# Patient Record
Sex: Male | Born: 1935 | Race: White | Hispanic: No | Smoking: Never smoker
Health system: Southern US, Community
[De-identification: ages and names within clinical notes are randomized; demographics above are authoritative.]

## PROBLEM LIST (undated history)

## (undated) DIAGNOSIS — I10 Essential (primary) hypertension: Secondary | ICD-10-CM

## (undated) DIAGNOSIS — E119 Type 2 diabetes mellitus without complications: Secondary | ICD-10-CM

## (undated) DIAGNOSIS — H409 Unspecified glaucoma: Secondary | ICD-10-CM

## (undated) DIAGNOSIS — M1990 Unspecified osteoarthritis, unspecified site: Secondary | ICD-10-CM

## (undated) HISTORY — PX: APPENDECTOMY: SHX54

---

## 2017-05-24 ENCOUNTER — Emergency Department: Payer: Medicare Other

## 2017-05-24 ENCOUNTER — Other Ambulatory Visit: Payer: Self-pay

## 2017-05-24 ENCOUNTER — Encounter: Payer: Self-pay | Admitting: Emergency Medicine

## 2017-05-24 ENCOUNTER — Emergency Department
Admission: EM | Admit: 2017-05-24 | Discharge: 2017-05-24 | Disposition: A | Discharge status: Home / Self Care | Payer: Medicare Other | Attending: Emergency Medicine | Admitting: Emergency Medicine

## 2017-05-24 DIAGNOSIS — Y999 Unspecified external cause status: Secondary | ICD-10-CM | POA: Insufficient documentation

## 2017-05-24 DIAGNOSIS — Z79899 Other long term (current) drug therapy: Secondary | ICD-10-CM | POA: Insufficient documentation

## 2017-05-24 DIAGNOSIS — Z794 Long term (current) use of insulin: Secondary | ICD-10-CM | POA: Insufficient documentation

## 2017-05-24 DIAGNOSIS — F0390 Unspecified dementia without behavioral disturbance: Secondary | ICD-10-CM | POA: Diagnosis not present

## 2017-05-24 DIAGNOSIS — W19XXXA Unspecified fall, initial encounter: Secondary | ICD-10-CM | POA: Diagnosis not present

## 2017-05-24 DIAGNOSIS — Y9210 Unspecified residential institution as the place of occurrence of the external cause: Secondary | ICD-10-CM | POA: Diagnosis not present

## 2017-05-24 DIAGNOSIS — I10 Essential (primary) hypertension: Secondary | ICD-10-CM | POA: Insufficient documentation

## 2017-05-24 DIAGNOSIS — S0003XA Contusion of scalp, initial encounter: Secondary | ICD-10-CM | POA: Diagnosis not present

## 2017-05-24 DIAGNOSIS — E119 Type 2 diabetes mellitus without complications: Secondary | ICD-10-CM | POA: Diagnosis not present

## 2017-05-24 DIAGNOSIS — S0990XA Unspecified injury of head, initial encounter: Secondary | ICD-10-CM | POA: Diagnosis present

## 2017-05-24 DIAGNOSIS — Y939 Activity, unspecified: Secondary | ICD-10-CM | POA: Diagnosis not present

## 2017-05-24 DIAGNOSIS — R296 Repeated falls: Secondary | ICD-10-CM

## 2017-05-24 HISTORY — DX: Unspecified osteoarthritis, unspecified site: M19.90

## 2017-05-24 HISTORY — DX: Type 2 diabetes mellitus without complications: E11.9

## 2017-05-24 HISTORY — DX: Essential (primary) hypertension: I10

## 2017-05-24 HISTORY — DX: Unspecified glaucoma: H40.9

## 2017-05-24 LAB — BASIC METABOLIC PANEL
Anion gap: 7 (ref 5–15)
BUN: 23 mg/dL — AB (ref 6–20)
CALCIUM: 8.8 mg/dL — AB (ref 8.9–10.3)
CO2: 27 mmol/L (ref 22–32)
CREATININE: 0.98 mg/dL (ref 0.61–1.24)
Chloride: 104 mmol/L (ref 101–111)
Glucose, Bld: 132 mg/dL — ABNORMAL HIGH (ref 65–99)
Potassium: 4.8 mmol/L (ref 3.5–5.1)
SODIUM: 138 mmol/L (ref 135–145)

## 2017-05-24 LAB — CBC WITH DIFFERENTIAL/PLATELET
BASOS ABS: 0.1 10*3/uL (ref 0–0.1)
BASOS PCT: 0 %
EOS ABS: 0.2 10*3/uL (ref 0–0.7)
EOS PCT: 1 %
HCT: 41.4 % (ref 40.0–52.0)
HEMOGLOBIN: 13.8 g/dL (ref 13.0–18.0)
Lymphocytes Relative: 18 %
Lymphs Abs: 2.6 10*3/uL (ref 1.0–3.6)
MCH: 30.2 pg (ref 26.0–34.0)
MCHC: 33.3 g/dL (ref 32.0–36.0)
MCV: 90.8 fL (ref 80.0–100.0)
Monocytes Absolute: 1.4 10*3/uL — ABNORMAL HIGH (ref 0.2–1.0)
Monocytes Relative: 10 %
NEUTROS PCT: 71 %
Neutro Abs: 10.1 10*3/uL — ABNORMAL HIGH (ref 1.4–6.5)
PLATELETS: 290 10*3/uL (ref 150–440)
RBC: 4.56 MIL/uL (ref 4.40–5.90)
RDW: 13.4 % (ref 11.5–14.5)
WBC: 14.4 10*3/uL — AB (ref 3.8–10.6)

## 2017-05-24 LAB — TROPONIN I: Troponin I: <0.03 ng/mL (ref <0.03)

## 2017-05-24 NOTE — Discharge Instructions (Signed)
Patient was evaluated after fall, his exam and evaluation are overall reassuring in the emergency department today.  Return to emergency department immediately for any worsening condition including confusion altered mental status, fever, trouble breathing, abdominal pain, vomiting, or any other symptoms concerning to you.

## 2017-05-24 NOTE — Clinical Social Work Note (Signed)
Clinical Social Work Assessment  Patient Details  Name: Max KalataRaymond Martinezgarcia MRN: [id number] Date of Birth: 05/01/1929  Date of referral:  05/24/17               Reason for consult:  Facility Placement, Family Concerns                Permission sought to share information with:  Family Supports, Magazine features editoracility Contact Representative Permission granted to share information::  Yes, Verbal Permission Granted  Name::     Daughter HCPOA- Read DriversMelissa Kirkland [phone]   Agency::  All facilities  Relationship::     Contact Information:     Housing/Transportation Living arrangements for the past 2 months:  Independent Living Facility(Has additional in home supports ( private pay)) Source of Information:  Patient, Adult Children Patient Interpreter Needed:  None Criminal Activity/Legal Involvement Pertinent to Current Situation/Hospitalization:  No - Comment as needed Significant Relationships:  Adult Children Lives with:  Self, Facility Resident Do you feel safe going back to the place where you live?   yes Need for family participation in patient care:  Yes (Comment)  Care giving concerns: Daughter very worried that he will take off- feels her father may need locked unit   Social Worker assessment / plan: LCSW introduced myself to patient and his daughter and obtained verbal consent to collect information to complete assessment. In discussion it became apparent she was not happy with level of care from ALF- or the locked memory unit. She was explained the process to engage with other facilities ( locked care units) that might be a better fit for her father. LCSW provided SNF- ALF memory care list and she is familiar with Fl2 and agreeable to have this information on the hub. She will make the decision to increase her fathers care by hiring private pay support. Patient is an 84 year old man who was recently widowed ( March 2018) He and his wife married for  63 years. Patient is diabetic ( needs insulin) He  is mobile, incontinent bladder, not bowel. He is blind in right eye ( [place]) He has a daughter and 3 sons. His daughter and granddaughter visit often at ALF and he is independent still with his ADL's. Daughter is agreeable for patient to return to ALF- and will provide increased personal care- List of personal in home supports provided. She is agreeable to start looking for either additional help or other ALF. Reviewed good self care practices with daughter and provided 1-1 supportive care- several resources shared with daughter to assist with all her new family concerns.  Employment status:  Retired Health and safety inspectornsurance information:  Medicare([employer]) PT Recommendations:  Not assessed at this time Information / Referral to community resources:  Skilled Holiday representativeursing Facility, Other (Comment Required)(ALF- Memory care facilities)  Patient/Family's Response to care:  Good understanding  Patient/Family's Understanding of and Emotional Response to Diagnosis, Current Treatment, and Prognosis:  Good understanding  Emotional Assessment Appearance:  Appears stated age Attitude/Demeanor/Rapport:  Complaining Affect (typically observed):  Appropriate, Depressed Orientation:  Oriented to Self, Oriented to Place, Oriented to Situation Alcohol / Substance use:  Not Applicable Psych involvement (Current and /or in the community):  No (Comment)  Discharge Needs  Concerns to be addressed:  No discharge needs identified Readmission within the last 30 days:  No Current discharge risk:  None Barriers to Discharge:  Continued Medical Work up   RagsdaleBandi, Quitmanlaudine M, LCSW 05/24/2017, 12:22 PM

## 2017-05-24 NOTE — [place] FL2 (Cosign Needed)
[place] MEDICAID FL2 LEVEL OF CARE SCREENING TOOL     IDENTIFICATION  Patient Name: Zan Orlick Birthdate: 03/16/1929 Sex: male Admission Date (Current Location): 05/24/2017  [place] and [place] Number:  Chiropodist and Address:  [facility], [address] [place], [place] [address]      Provider Number: [id number]  Attending Physician Name and Address:  Governor Rooks, MD  Relative Name and Phone Number:     Read Drivers [phone]   Current Level of Care: Hospital Recommended Level of Care: Assisted Living Facility, Memory Care Prior Approval Number:    Date Approved/Denied:   PASRR Number:    Discharge Plan: Domiciliary (Rest home)    Current Diagnoses: There are no active problems to display for this patient.   Orientation RESPIRATION BLADDER Height & Weight     Self, Place  Normal Incontinent Weight: 256 lb (116.1 kg) Height:  5' 7.5" (171.5 cm)  BEHAVIORAL SYMPTOMS/MOOD NEUROLOGICAL BOWEL NUTRITION STATUS    (Patient does not have Parkinsons however Neuroligist in June stated he might be having slight mini strokes) Continent Diet(Diabetic)  AMBULATORY STATUS COMMUNICATION OF NEEDS Skin   Supervision Verbally Normal                       Personal Care Assistance Level of Assistance  Bathing, Feeding, Dressing, Total care Bathing Assistance: Independent Feeding assistance: Independent Dressing Assistance: Independent Total Care Assistance: Independent   Functional Limitations Info  Sight, Hearing, Speech Sight Info: Impaired(Right eye blind MD) Hearing Info: Adequate Speech Info: Adequate    SPECIAL CARE FACTORS FREQUENCY                       Contractures Contractures Info: Not present    Additional Factors Info  Insulin Sliding Scale       Insulin Sliding Scale Info: See Medications       Current Medications (05/24/2017):  This is the current hospital active medication list No  current facility-administered medications for this encounter.    Current Outpatient Medications  Medication Sig Dispense Refill  . acetaminophen (TYLENOL) 500 MG tablet Take 500 mg by mouth 2 (two) times daily as needed.    Marland Kitchen ascorbic acid (VITAMIN C) 500 MG tablet Take 500 mg by mouth daily.    . bisacodyl (DULCOLAX) 5 MG EC tablet Take 10 mg by mouth daily.    . brimonidine-timolol (COMBIGAN) 0.2-0.5 % ophthalmic solution Place 1 drop into the right eye 2 (two) times daily.    . cycloSPORINE (RESTASIS) 0.05 % ophthalmic emulsion Place 1 drop into the right eye 2 (two) times daily.    . Dulaglutide (TRULICITY) 1.5 MG/0.5ML SOPN Inject 1.5 mg into the skin once a week.    . DULoxetine (CYMBALTA) 60 MG capsule Take 60 mg by mouth daily.    Marland Kitchen glipiZIDE (GLUCOTROL XL) 10 MG 24 hr tablet Take 20 mg by mouth daily with breakfast.    . insulin glargine (LANTUS) 100 UNIT/ML injection Inject 20 Units into the skin 2 (two) times daily.    Marland Kitchen latanoprost (XALATAN) 0.005 % ophthalmic solution Place 1 drop into the right eye at bedtime.    Marland Kitchen lisinopril (PRINIVIL,ZESTRIL) 10 MG tablet Take 10 mg by mouth daily.    Marland Kitchen losartan (COZAAR) 100 MG tablet Take 100 mg by mouth daily.    . mirabegron ER (MYRBETRIQ) 50 MG TB24 tablet Take 50 mg by mouth daily.    Marland Kitchen  mirtazapine (REMERON) 7.5 MG tablet Take 7.5 mg by mouth at bedtime.    . Multiple Vitamin (MULTIVITAMIN WITH MINERALS) TABS tablet Take 1 tablet by mouth daily.    . pioglitazone (ACTOS) 15 MG tablet Take 15 mg by mouth daily.    . ranitidine (ZANTAC) 150 MG tablet Take 150 mg by mouth 2 (two) times daily.    . solifenacin (VESICARE) 10 MG tablet Take 10 mg by mouth daily.    . traZODone (DESYREL) 50 MG tablet Take 50 mg by mouth every 8 (eight) hours as needed for sleep.       Discharge Medications: Please see discharge summary for a list of discharge medications.  Relevant Imaging Results:  Relevant Lab Results:   Additional Information  SSN  [id number]  Cheron SchaumannBandi, Chibueze Beasley M, [place]

## 2017-05-24 NOTE — Progress Notes (Signed)
LCSW completed assessment and Fl2 started and information sent Via the HUB for other ALF memory care facilities for this patients future needs the facilities will contact patient directly.   Daughter is agreeable for patient to return to ALF- and will provide increased personal care- List of personal in home supports provided. She is agreeable to start looking for either additional help or other ALF. Reviewed good self care practices with daughter and provided 1-1 supportive care- several resources shared with daughter to assist with all her new family concerns.  No further needs at this time

## 2017-05-24 NOTE — ED Notes (Signed)
Pt daughter is POA and pt is a DNR. [facility] has [employer]. Daugther provided this RN will POA paperwork to apply to chart.

## 2017-05-24 NOTE — ED Triage Notes (Signed)
[facility]; pt with hematoma to the back of his head; pt confused to events this am; pt says he was being wheeled into the hospital for a check up when he fell; pt oriented to person and place, not time; talking in complete coherent sentences;

## 2017-05-24 NOTE — ED Notes (Signed)
Pt daugther states that pt was dx with mini strokes and not parkinson in 06/2016. [facility] [place]. Daugther requesting information/ referral to switch pt to local Neuro. EDP made aware.

## 2017-05-24 NOTE — ED Provider Notes (Signed)
[facility] Emergency Department Provider Note ____________________________________________   I have reviewed the triage vital signs and the triage nursing note.  HISTORY  Chief Complaint Fall and Back Pain   Historian Level 5 Caveat History Limited by bit of a poor historian  HPI Max [name] is a 84 y.o. male arrives from assisted living, apparently unwitnessed fall.  Patient is not really able to tell me clearly how or why he fell.  Other than that he seems to be pleasant and interactive.  He is denying pain, but does report having a bump on the back of his scalp.  He states he is not on any blood thinners.  Is reporting some back pain which she is really kind of nonspecific about it seems like it is in the mid or lower T and upper L-spine.   Past Medical History:  Diagnosis Date  . Arthritis   . Diabetes mellitus without complication (HCC)   . Glaucoma   . Hypertension     There are no active problems to display for this patient.   Past Surgical History:  Procedure Laterality Date  . APPENDECTOMY      Prior to Admission medications   Medication Sig Start Date End Date Taking? Authorizing Provider  acetaminophen (TYLENOL) 500 MG tablet Take 500 mg by mouth 2 (two) times daily as needed.   Yes [provider]  ascorbic acid (VITAMIN C) 500 MG tablet Take 500 mg by mouth daily.   Yes [provider]  bisacodyl (DULCOLAX) 5 MG EC tablet Take 10 mg by mouth daily.   Yes [provider]  brimonidine-timolol (COMBIGAN) 0.2-0.5 % ophthalmic solution Place 1 drop into the right eye 2 (two) times daily.   Yes [provider]  cycloSPORINE (RESTASIS) 0.05 % ophthalmic emulsion Place 1 drop into the right eye 2 (two) times daily.   Yes [provider]  Dulaglutide (TRULICITY) 1.5 MG/0.5ML SOPN Inject 1.5 mg into the skin once a week.   Yes [provider]  DULoxetine (CYMBALTA) 60 MG capsule Take  60 mg by mouth daily.   Yes [provider]  glipiZIDE (GLUCOTROL XL) 10 MG 24 hr tablet Take 20 mg by mouth daily with breakfast.   Yes [provider]  insulin glargine (LANTUS) 100 UNIT/ML injection Inject 20 Units into the skin 2 (two) times daily.   Yes [provider]  latanoprost (XALATAN) 0.005 % ophthalmic solution Place 1 drop into the right eye at bedtime.   Yes [provider]  lisinopril (PRINIVIL,ZESTRIL) 10 MG tablet Take 10 mg by mouth daily.   Yes [provider]  losartan (COZAAR) 100 MG tablet Take 100 mg by mouth daily.   Yes [provider]  mirabegron ER (MYRBETRIQ) 50 MG TB24 tablet Take 50 mg by mouth daily.   Yes [provider]  mirtazapine (REMERON) 7.5 MG tablet Take 7.5 mg by mouth at bedtime.   Yes [provider]  Multiple Vitamin (MULTIVITAMIN WITH MINERALS) TABS tablet Take 1 tablet by mouth daily.   Yes [provider]  pioglitazone (ACTOS) 15 MG tablet Take 15 mg by mouth daily.   Yes [provider]  ranitidine (ZANTAC) 150 MG tablet Take 150 mg by mouth 2 (two) times daily.   Yes [provider]  solifenacin (VESICARE) 10 MG tablet Take 10 mg by mouth daily.   Yes [provider]  traZODone (DESYREL) 50 MG tablet Take 50 mg by mouth every 8 (eight)  hours as needed for sleep.   Yes [provider]    No Known Allergies  History reviewed. No pertinent family history.  Social History Social History   Tobacco Use  . Smoking status: Never Smoker  . Smokeless tobacco: Never Used  Substance Use Topics  . Alcohol use: Yes    Comment: "very very little"  . Drug use: Never    Review of Systems  Constitutional: Negative for fever. Eyes: Negative for visual changes. ENT: Negative for sore throat. Cardiovascular: Negative for chest pain. Respiratory: Negative for shortness of breath. Gastrointestinal: Negative for abdominal pain, vomiting  and diarrhea. Genitourinary: Negative for dysuria. Musculoskeletal: Positive as per HPI for back pain. Skin: Negative for rash. Neurological: Negative for headache.  ____________________________________________   PHYSICAL EXAM:  VITAL SIGNS: ED Triage Vitals  Enc Vitals Group     BP 05/24/17 0658 (!) 141/106     Pulse Rate 05/24/17 0658 82     Resp 05/24/17 0658 20     Temp 05/24/17 0658 97.8 F (36.6 C)     Temp Source 05/24/17 0658 Oral     SpO2 05/24/17 0652 97 %     Weight 05/24/17 0700 256 lb (116.1 kg)     Height 05/24/17 0700 5' 7.5" (1.715 m)     Head Circumference --      Peak Flow --      Pain Score 05/24/17 0659 5     Pain Loc --      Pain Edu? --      Excl. in GC? --      Constitutional: Alert and cooperative, bit of a poor historian. Well appearing and in no distress. HEENT   Head: Hematoma without skin break to the posterior scalp.      Eyes: Conjunctivae are normal. Pupils equal and round.       Ears:         Nose: No congestion/rhinnorhea.   Mouth/Throat: Mucous membranes are moist.   Neck: No stridor.  No midline C-spine tenderness or step-off to palpation and range of motion. Cardiovascular/Chest: Normal rate, regular rhythm.  No murmurs, rubs, or gallops. Respiratory: Normal respiratory effort without tachypnea nor retractions. Breath sounds are clear and equal bilaterally. No wheezes/rales/rhonchi. Gastrointestinal: Soft. No distention, no guarding, no rebound. Nontender.  Obese Genitourinary/rectal:Deferred Musculoskeletal: Nontender with normal range of motion in all extremities. No joint effusions.  No lower extremity tenderness.  2 Plus lower extremity edema. Neurologic:  Normal speech and language. No gross or focal neurologic deficits are appreciated. Skin:  Skin is warm, dry and intact. No rash noted. Psychiatric: Mood and affect are normal. Speech and behavior are normal. Patient exhibits appropriate insight and  judgment.   ____________________________________________  LABS (pertinent positives/negatives) I, Governor Rooks, MD the attending physician have reviewed the labs noted below.  Labs Reviewed  BASIC METABOLIC PANEL - Abnormal; Notable for the following components:      Result Value   Glucose, Bld 132 (*)    BUN 23 (*)    Calcium 8.8 (*)    All other components within normal limits  CBC WITH DIFFERENTIAL/PLATELET - Abnormal; Notable for the following components:   WBC 14.4 (*)    Neutro Abs 10.1 (*)    Monocytes Absolute 1.4 (*)    All other components within normal limits  TROPONIN I  URINALYSIS, COMPLETE (UACMP) WITH MICROSCOPIC    ____________________________________________    EKG I, Governor Rooks, MD, the attending physician have personally viewed and interpreted  all ECGs.  81 bpm.  Irregularly irregular rhythm.  Appears to be normal sinus rhythm with sinus arrhythmia.  Narrow QRS.  Normal axis.  Normal ST and T wave ____________________________________________  RADIOLOGY   CT head without contrast radiologist interpretation:  IMPRESSION: 1. No acute abnormality. 2. Moderate diffuse cerebral and cerebellar atrophy. 3. Mild to moderate chronic small vessel white matter ischemic changes in both cerebral hemispheres. 4. Minimal chronic left maxillary sinusitis.   DG thoracic and lumbar x-rays interpreted by radiologist: IMPRESSION: No fracture.  Multilevel degenerative changes. __________________________________________  PROCEDURES  Procedure(s) performed: None  Procedures  Critical Care performed: None   ____________________________________________  ED COURSE / ASSESSMENT AND PLAN  Pertinent labs & imaging results that were available during my care of the patient were reviewed by me and considered in my medical decision making (see chart for details).   Patient arrived for evaluation after unwitnessed fall.  It sounds like it was probably mechanical,  although the patient is a very poor historian, we will go ahead and check laboratory studies and an EKG out of an abundance of precaution from the standpoint of whether not this could have been syncope it seems unlikely.  From a traumatic injury standpoint, he has a hematoma to the back of his scalp and is complaining of some kind of generalized pain to his back.  Laboratory studies are overall reassuring.  Uncertain etiology of slightly elevated white blood cell count.  He is not having other symptoms of infection.  I spoke with the family, they did not feel like it was likely that he had an acute urinary tract infection because he has had that checked recently was negative.  He is not coughing.  He is not hypoxic.  Not suspicious of pneumonia or upper respiratory infection.  Plan will be for patient to return to his assisted living facility, and family has resources for accessing additional care.   CONSULTATIONS:   Family spoke with social work, given resources.   Patient / Family / Caregiver informed of clinical course, medical decision-making process, and agree with plan.   I discussed return precautions, follow-up instructions, and discharge instructions with patient and/or family.  Discharge Instructions : Patient was evaluated after fall, his exam and evaluation are overall reassuring in the emergency department today.  Return to emergency department immediately for any worsening condition including confusion altered mental status, fever, trouble breathing, abdominal pain, vomiting, or any other symptoms concerning to you.    ___________________________________________   FINAL CLINICAL IMPRESSION(S) / ED DIAGNOSES   Final diagnoses:  Unwitnessed fall  Dementia without behavioral disturbance, unspecified dementia type      ___________________________________________        Note: This dictation was prepared with Dragon dictation. Any transcriptional errors that  result from this process are unintentional    Governor Rooks, MD 05/24/17 1435

## 2017-08-04 ENCOUNTER — Emergency Department: Payer: Medicare Other

## 2017-08-04 ENCOUNTER — Emergency Department
Admission: EM | Admit: 2017-08-04 | Discharge: 2017-08-04 | Disposition: A | Discharge status: Home / Self Care | Payer: Medicare Other | Attending: Student in an Organized Health Care Education/Training Program | Admitting: Student in an Organized Health Care Education/Training Program

## 2017-08-04 ENCOUNTER — Other Ambulatory Visit: Payer: Self-pay

## 2017-08-04 DIAGNOSIS — F0390 Unspecified dementia without behavioral disturbance: Secondary | ICD-10-CM | POA: Insufficient documentation

## 2017-08-04 DIAGNOSIS — Z794 Long term (current) use of insulin: Secondary | ICD-10-CM | POA: Diagnosis not present

## 2017-08-04 DIAGNOSIS — Z79899 Other long term (current) drug therapy: Secondary | ICD-10-CM | POA: Insufficient documentation

## 2017-08-04 DIAGNOSIS — W19XXXA Unspecified fall, initial encounter: Secondary | ICD-10-CM

## 2017-08-04 DIAGNOSIS — E119 Type 2 diabetes mellitus without complications: Secondary | ICD-10-CM | POA: Diagnosis not present

## 2017-08-04 DIAGNOSIS — I10 Essential (primary) hypertension: Secondary | ICD-10-CM | POA: Insufficient documentation

## 2017-08-04 DIAGNOSIS — M25511 Pain in right shoulder: Secondary | ICD-10-CM

## 2017-08-04 LAB — CBC
HEMATOCRIT: 41.1 % (ref 40.0–52.0)
HEMOGLOBIN: 14 g/dL (ref 13.0–18.0)
MCH: 30.9 pg (ref 26.0–34.0)
MCHC: 34 g/dL (ref 32.0–36.0)
MCV: 91.1 fL (ref 80.0–100.0)
Platelets: 267 10*3/uL (ref 150–440)
RBC: 4.51 MIL/uL (ref 4.40–5.90)
RDW: 14 % (ref 11.5–14.5)
WBC: 10.4 10*3/uL (ref 3.8–10.6)

## 2017-08-04 LAB — URINALYSIS, COMPLETE (UACMP) WITH MICROSCOPIC
Bacteria, UA: NONE SEEN
Bilirubin Urine: NEGATIVE
Glucose, UA: NEGATIVE mg/dL
HGB URINE DIPSTICK: NEGATIVE
Ketones, ur: NEGATIVE mg/dL
Leukocytes, UA: NEGATIVE
Nitrite: NEGATIVE
PH: 7 (ref 5.0–8.0)
Protein, ur: NEGATIVE mg/dL
SPECIFIC GRAVITY, URINE: 1.008 (ref 1.005–1.030)

## 2017-08-04 LAB — BASIC METABOLIC PANEL
Anion gap: 8 (ref 5–15)
BUN: 16 mg/dL (ref 6–20)
CHLORIDE: 101 mmol/L (ref 101–111)
CO2: 29 mmol/L (ref 22–32)
CREATININE: 0.84 mg/dL (ref 0.61–1.24)
Calcium: 8.9 mg/dL (ref 8.9–10.3)
GFR calc non Af Amer: >60 mL/min (ref >60)
GLUCOSE: 113 mg/dL — AB (ref 65–99)
Potassium: 4.2 mmol/L (ref 3.5–5.1)
Sodium: 138 mmol/L (ref 135–145)

## 2017-08-04 LAB — CK: Total CK: 53 U/L (ref 49–397)

## 2017-08-04 NOTE — ED Notes (Signed)
Upon going in to do in/out cath, family reports pt just used urinal.  Pt provided cup of water; will reattempt to collect urinary specimen.

## 2017-08-04 NOTE — ED Notes (Signed)
Pt had episode of urinary incontinence; changed sheets. Attempted to use urinal-cannot provide urine sample at this time.

## 2017-08-04 NOTE — ED Provider Notes (Signed)
[facility] Emergency Department Provider Note    First MD Initiated Contact with Patient 08/04/17 0915     (approximate)  I have reviewed the triage vital signs and the nursing notes.   HISTORY  Chief Complaint Fall  Level V Caveat:  dementia  HPI Max [name] is a 84 y.o. male the history of diabetes as well as hypertension presents to the ER after unwitnessed fall with unknown downtime.  Patient states he thinks it is only 3 hours but cannot confirm.  History limited as it patient is confused and thinks that it is 25.  Denies any headache.  No nausea or vomiting.  No recent fevers.  His only pain is right shoulder pain which he states is chronic.    Past Medical History:  Diagnosis Date  . Arthritis   . Diabetes mellitus without complication (HCC)   . Glaucoma   . Hypertension    No family history on file. Past Surgical History:  Procedure Laterality Date  . APPENDECTOMY     There are no active problems to display for this patient.     Prior to Admission medications   Medication Sig Start Date End Date Taking? Authorizing Provider  ascorbic acid (VITAMIN C) 500 MG tablet Take 500 mg by mouth daily.   Yes [provider]  bisacodyl (DULCOLAX) 5 MG EC tablet Take 10 mg by mouth daily.   Yes [provider]  brimonidine-timolol (COMBIGAN) 0.2-0.5 % ophthalmic solution Place 1 drop into the right eye 2 (two) times daily.   Yes [provider]  cycloSPORINE (RESTASIS) 0.05 % ophthalmic emulsion Place 1 drop into the right eye 2 (two) times daily.   Yes [provider]  DULoxetine (CYMBALTA) 60 MG capsule Take 60 mg by mouth daily.   Yes [provider]  glipiZIDE (GLUCOTROL XL) 10 MG 24 hr tablet Take 20 mg by mouth daily with breakfast.   Yes [provider]  insulin glargine (LANTUS) 100 UNIT/ML injection Inject 20 Units into the skin 2 (two) times daily.   Yes [provider]    latanoprost (XALATAN) 0.005 % ophthalmic solution Place 1 drop into the right eye at bedtime.   Yes [provider]  losartan (COZAAR) 100 MG tablet Take 100 mg by mouth daily.   Yes [provider]  mirabegron ER (MYRBETRIQ) 50 MG TB24 tablet Take 50 mg by mouth daily.   Yes [provider]  mirtazapine (REMERON) 7.5 MG tablet Take 7.5 mg by mouth at bedtime.   Yes [provider]  Multiple Vitamin (MULTIVITAMIN WITH MINERALS) TABS tablet Take 1 tablet by mouth daily.   Yes [provider]  pioglitazone (ACTOS) 15 MG tablet Take 15 mg by mouth daily.   Yes [provider]  ranitidine (ZANTAC) 150 MG tablet Take 150 mg by mouth daily.    Yes [provider]  solifenacin (VESICARE) 10 MG tablet Take 10 mg by mouth daily.   Yes [provider]  traZODone (DESYREL) 50 MG tablet Take 50 mg by mouth 2 (two) times daily.    Yes [provider]  acetaminophen (TYLENOL) 500 MG tablet Take 500 mg by mouth 2 (two) times daily as needed.    [provider]    Allergies Patient has no known allergies.    Social History Social History   Tobacco Use  . Smoking status: Never Smoker  . Smokeless tobacco: Never Used  Substance Use Topics  . Alcohol  use: Yes    Comment: "very very little"  . Drug use: Never    Review of Systems Patient denies headaches, rhinorrhea, blurry vision, numbness, shortness of breath, chest pain, edema, cough, abdominal pain, nausea, vomiting, diarrhea, dysuria, fevers, rashes or hallucinations unless otherwise stated above in HPI. ____________________________________________   PHYSICAL EXAM:  VITAL SIGNS: Vitals:   08/04/17 1200 08/04/17 1230  BP: (!) 150/103 129/78  Pulse: 72 69  Resp: 15 17  Temp:    SpO2: 100% 98%    Constitutional: Alert and disoriented x 2  Eyes: Conjunctivae are normal.  Head: Atraumatic. Nose: No congestion/rhinnorhea. Mouth/Throat: Mucous  membranes are moist.   Neck: No stridor. Painless ROM.  Cardiovascular: Normal rate, regular rhythm. Grossly normal heart sounds.  Good peripheral circulation. Respiratory: Normal respiratory effort.  No retractions. Lungs CTAB. Gastrointestinal: Soft and nontender. No distention. No abdominal bruits. No CVA tenderness. Genitourinary: normal external genitalia Musculoskeletal: No lower extremity tenderness nor edema.  No joint effusions. Neurologic:  Normal speech and language. No gross focal neurologic deficits are appreciated. No facial droop Skin:  Skin is warm, dry and intact. No rash noted. Psychiatric: Mood and affect are normal. Speech and behavior are normal.   ____________________________________________   LABS (all labs ordered are listed, but only abnormal results are displayed)  Results for orders placed or performed during the hospital encounter of 08/04/17 (from the past 24 hour(s))  CBC     Status: None   Collection Time: 08/04/17 10:04 AM  Result Value Ref Range   WBC 10.4 3.8 - 10.6 K/uL   RBC 4.51 4.40 - 5.90 MIL/uL   Hemoglobin 14.0 13.0 - 18.0 g/dL   HCT 46.941.1 62.940.0 - 52.852.0 %   MCV 91.1 80.0 - 100.0 fL   MCH 30.9 26.0 - 34.0 pg   MCHC 34.0 32.0 - 36.0 g/dL   RDW 41.314.0 24.411.5 - 01.014.5 %   Platelets 267 150 - 440 K/uL  Basic metabolic panel     Status: Abnormal   Collection Time: 08/04/17 10:04 AM  Result Value Ref Range   Sodium 138 135 - 145 mmol/L   Potassium 4.2 3.5 - 5.1 mmol/L   Chloride 101 101 - 111 mmol/L   CO2 29 22 - 32 mmol/L   Glucose, Bld 113 (H) 65 - 99 mg/dL   BUN 16 6 - 20 mg/dL   Creatinine, Ser 2.720.84 0.61 - 1.24 mg/dL   Calcium 8.9 8.9 - 53.610.3 mg/dL   GFR calc non Af Amer >60 >60 mL/min   GFR calc Af Amer >60 >60 mL/min   Anion gap 8 5 - 15  CK     Status: None   Collection Time: 08/04/17 10:04 AM  Result Value Ref Range   Total CK 53 49 - 397 U/L  Urinalysis, Complete w Microscopic     Status: Abnormal   Collection Time: 08/04/17  2:20 PM    Result Value Ref Range   Color, Urine STRAW (A) YELLOW   APPearance CLEAR (A) CLEAR   Specific Gravity, Urine 1.008 1.005 - 1.030   pH 7.0 5.0 - 8.0   Glucose, UA NEGATIVE NEGATIVE mg/dL   Hgb urine dipstick NEGATIVE NEGATIVE   Bilirubin Urine NEGATIVE NEGATIVE   Ketones, ur NEGATIVE NEGATIVE mg/dL   Protein, ur NEGATIVE NEGATIVE mg/dL   Nitrite NEGATIVE NEGATIVE   Leukocytes, UA NEGATIVE NEGATIVE   RBC / HPF 0-5 0 - 5 RBC/hpf   WBC, UA 0-5 0 - 5 WBC/hpf  Bacteria, UA NONE SEEN NONE SEEN   Squamous Epithelial / LPF 0-5 0 - 5   ____________________________________________  ED ECG REPORT I, Willy Eddy, the attending physician, personally viewed and interpreted this ECG.   Date: 08/04/2017  EKG Time: 14:46  Rate: 75  Rhythm: normal EKG, normal sinus rhythm, unchanged from previous tracings  Axis: normal  Intervals: normal intervals,  ST&T Change: no stemi  ____________________________________________  RADIOLOGY  I personally reviewed all radiographic images ordered to evaluate for the above acute complaints and reviewed radiology reports and findings.  These findings were personally discussed with the patient.  Please see medical record for radiology report.  ____________________________________________   PROCEDURES  Procedure(s) performed:  Procedures    Critical Care performed: no ____________________________________________   INITIAL IMPRESSION / ASSESSMENT AND PLAN / ED COURSE  Pertinent labs & imaging results that were available during my care of the patient were reviewed by me and considered in my medical decision making (see chart for details).   DDX: fracture, contusion, strain, Dehydration, sepsis, pna, uti, hypoglycemia, cva, drug effect, withdrawal, encephalitis   Max [name] is a 84 y.o. who presents to the ED with right shoulder pain after unwitnessed fall.  Patient states he was may be down an hour to therefore CK will be sent off.   Denies any chest pain but does have right shoulder pain.  Denies any numbness or tingling.  No nausea or vomiting.  CT imaging will be ordered to evaluate for above complaints as well as radiographs.  Clinical Course as of Aug 05 1450  Tue Aug 04, 2017  1437 Patient reassessed.  States that he is wanting to go back to his facility.  Feels well.  Blood work and work-up in the ER thus far is reassuring.  Family at bedside states that he is currently acting at his baseline.  I do believe patient stable and appropriate for outpatient follow-up.  Have discussed with the patient and available family all diagnostics and treatments performed thus far and all questions were answered to the best of my ability. The patient demonstrates understanding and agreement with plan.    [PR]    Clinical Course User Index [PR] Willy Eddy, MD     As part of my medical decision making, I reviewed the following data within the electronic MEDICAL RECORD NUMBER Nursing notes reviewed and incorporated, Labs reviewed, notes from prior ED visits and [place] Controlled Substance Database   ____________________________________________   FINAL CLINICAL IMPRESSION(S) / ED DIAGNOSES  Final diagnoses:  Fall, initial encounter  Acute pain of right shoulder      NEW MEDICATIONS STARTED DURING THIS VISIT:  New Prescriptions   No medications on file     Note:  This document was prepared using Dragon voice recognition software and may include unintentional dictation errors.    Willy Eddy, MD 08/04/17 [phone]

## 2017-08-04 NOTE — ED Notes (Signed)
ED Provider at bedside. 

## 2017-08-04 NOTE — ED Triage Notes (Addendum)
To ER via [facility] from [facility] after unwitnessed fall. Unknown downtime, pt seems to think about 2-3 hours. Pt c/o right shoulder pain since-he reports chronic right shoulder pain. Staff told EMS right shoulder pain is new. No obvious deformity. VSS with EMS. Alert, disoriented to time.

## 2018-02-19 ENCOUNTER — Emergency Department: Payer: Medicare Other

## 2018-02-19 ENCOUNTER — Emergency Department
Admission: EM | Admit: 2018-02-19 | Discharge: 2018-02-19 | Disposition: A | Discharge status: Skilled Nursing Facility | Payer: Medicare Other | Attending: Emergency Medicine | Admitting: Emergency Medicine

## 2018-02-19 ENCOUNTER — Other Ambulatory Visit: Payer: Self-pay

## 2018-02-19 ENCOUNTER — Encounter: Payer: Self-pay | Admitting: Emergency Medicine

## 2018-02-19 DIAGNOSIS — R410 Disorientation, unspecified: Secondary | ICD-10-CM | POA: Insufficient documentation

## 2018-02-19 DIAGNOSIS — I10 Essential (primary) hypertension: Secondary | ICD-10-CM | POA: Insufficient documentation

## 2018-02-19 DIAGNOSIS — R4182 Altered mental status, unspecified: Secondary | ICD-10-CM | POA: Diagnosis present

## 2018-02-19 DIAGNOSIS — F0390 Unspecified dementia without behavioral disturbance: Secondary | ICD-10-CM | POA: Diagnosis not present

## 2018-02-19 DIAGNOSIS — E119 Type 2 diabetes mellitus without complications: Secondary | ICD-10-CM | POA: Insufficient documentation

## 2018-02-19 DIAGNOSIS — Z79899 Other long term (current) drug therapy: Secondary | ICD-10-CM | POA: Insufficient documentation

## 2018-02-19 DIAGNOSIS — Z043 Encounter for examination and observation following other accident: Secondary | ICD-10-CM | POA: Diagnosis not present

## 2018-02-19 DIAGNOSIS — W19XXXA Unspecified fall, initial encounter: Secondary | ICD-10-CM

## 2018-02-19 LAB — COMPREHENSIVE METABOLIC PANEL
ALK PHOS: 84 U/L (ref 38–126)
ALT: 16 U/L (ref 0–44)
AST: 17 U/L (ref 15–41)
Albumin: 3.8 g/dL (ref 3.5–5.0)
Anion gap: 8 (ref 5–15)
BUN: 14 mg/dL (ref 8–23)
CALCIUM: 9.3 mg/dL (ref 8.9–10.3)
CHLORIDE: 101 mmol/L (ref 98–111)
CO2: 29 mmol/L (ref 22–32)
CREATININE: 0.83 mg/dL (ref 0.61–1.24)
Glucose, Bld: 254 mg/dL — ABNORMAL HIGH (ref 70–99)
Potassium: 4.2 mmol/L (ref 3.5–5.1)
Sodium: 138 mmol/L (ref 135–145)
Total Bilirubin: 0.9 mg/dL (ref 0.3–1.2)
Total Protein: 7.7 g/dL (ref 6.5–8.1)

## 2018-02-19 LAB — CBC WITH DIFFERENTIAL/PLATELET
ABS IMMATURE GRANULOCYTES: 0.1 10*3/uL — AB (ref 0.00–0.07)
Basophils Absolute: 0.1 10*3/uL (ref 0.0–0.1)
Basophils Relative: 0 %
Eosinophils Absolute: 0.1 10*3/uL (ref 0.0–0.5)
Eosinophils Relative: 1 %
HCT: 47.4 % (ref 39.0–52.0)
Hemoglobin: 15.1 g/dL (ref 13.0–17.0)
IMMATURE GRANULOCYTES: 1 %
LYMPHS ABS: 2.3 10*3/uL (ref 0.7–4.0)
LYMPHS PCT: 13 %
MCH: 29.8 pg (ref 26.0–34.0)
MCHC: 31.9 g/dL (ref 30.0–36.0)
MCV: 93.7 fL (ref 80.0–100.0)
MONO ABS: 1.2 10*3/uL — AB (ref 0.1–1.0)
MONOS PCT: 7 %
NEUTROS ABS: 13.4 10*3/uL — AB (ref 1.7–7.7)
NEUTROS PCT: 78 %
Platelets: 283 10*3/uL (ref 150–400)
RBC: 5.06 MIL/uL (ref 4.22–5.81)
RDW: 13.2 % (ref 11.5–15.5)
WBC: 17.1 10*3/uL — ABNORMAL HIGH (ref 4.0–10.5)
nRBC: 0 % (ref 0.0–0.2)

## 2018-02-19 LAB — URINALYSIS, COMPLETE (UACMP) WITH MICROSCOPIC
Bacteria, UA: NONE SEEN
Bilirubin Urine: NEGATIVE
Hgb urine dipstick: NEGATIVE
KETONES UR: NEGATIVE mg/dL
Leukocytes, UA: NEGATIVE
Nitrite: NEGATIVE
PROTEIN: NEGATIVE mg/dL
Specific Gravity, Urine: 1.018 (ref 1.005–1.030)
pH: 5 (ref 5.0–8.0)

## 2018-02-19 LAB — CK: CK TOTAL: 54 U/L (ref 49–397)

## 2018-02-19 NOTE — ED Triage Notes (Signed)
Pt to ED via [facility] from [facility]. Staff reports that pt was found on the floor this morning. Unsure when pt fell. EMS reports that pt has strong smell of urine. Staff at [facility] reports sending out urine sample 2 days ago but have not gotten results. Staff states that for the past 3 days pt has had increased confusion. Pt in NAD at this time.

## 2018-02-19 NOTE — ED Notes (Signed)
[facility] at bedside to transport [facility] back to [facility]

## 2018-02-19 NOTE — ED Notes (Signed)
Pt awaiting transport back to [facility]. Pt daughter at bedside

## 2018-02-19 NOTE — ED Provider Notes (Signed)
[facility] Emergency Department Provider Note  Time seen: 7:22 AM  I have reviewed the triage vital signs and the nursing notes.   HISTORY  Chief Complaint Fall and Altered Mental Status    HPI Max [name] is a 84 y.o. male with a past medical history of dementia, diabetes, hypertension from [facility] presents to the emergency department after a unwitnessed fall.  Per staff patient had an unwitnessed fall last night, not entirely clear how long the patient was down for before he was found this morning.  They also state the patient has seemed more confused over the past 3 days.  They sent a urine sample several days ago but have not gotten the results back.  Here the patient is awake and alert, he is pleasant but is not able to contribute to his history or review of systems.   Past Medical History:  Diagnosis Date  . Arthritis   . Diabetes mellitus without complication (HCC)   . Glaucoma   . Hypertension     There are no active problems to display for this patient.   Past Surgical History:  Procedure Laterality Date  . APPENDECTOMY      Prior to Admission medications   Medication Sig Start Date End Date Taking? Authorizing Provider  acetaminophen (TYLENOL) 500 MG tablet Take 500 mg by mouth 2 (two) times daily as needed.    [provider]  ascorbic acid (VITAMIN C) 500 MG tablet Take 500 mg by mouth daily.    [provider]  bisacodyl (DULCOLAX) 5 MG EC tablet Take 10 mg by mouth daily.    [provider]  brimonidine-timolol (COMBIGAN) 0.2-0.5 % ophthalmic solution Place 1 drop into the right eye 2 (two) times daily.    [provider]  cycloSPORINE (RESTASIS) 0.05 % ophthalmic emulsion Place 1 drop into the right eye 2 (two) times daily.    [provider]  DULoxetine (CYMBALTA) 60 MG capsule Take 60 mg by mouth daily.    [provider]  glipiZIDE (GLUCOTROL XL) 10 MG 24  hr tablet Take 20 mg by mouth daily with breakfast.    [provider]  insulin glargine (LANTUS) 100 UNIT/ML injection Inject 20 Units into the skin 2 (two) times daily.    [provider]  latanoprost (XALATAN) 0.005 % ophthalmic solution Place 1 drop into the right eye at bedtime.    [provider]  losartan (COZAAR) 100 MG tablet Take 100 mg by mouth daily.    [provider]  mirabegron ER (MYRBETRIQ) 50 MG TB24 tablet Take 50 mg by mouth daily.    [provider]  mirtazapine (REMERON) 7.5 MG tablet Take 7.5 mg by mouth at bedtime.    [provider]  Multiple Vitamin (MULTIVITAMIN WITH MINERALS) TABS tablet Take 1 tablet by mouth daily.    [provider]  pioglitazone (ACTOS) 15 MG tablet Take 15 mg by mouth daily.    [provider]  ranitidine (ZANTAC) 150 MG tablet Take 150 mg by mouth daily.     [provider]  solifenacin (VESICARE) 10 MG tablet Take 10 mg by mouth daily.    [provider]  traZODone (DESYREL) 50 MG tablet Take 50 mg by mouth 2 (two) times daily.     [provider]    No Known Allergies  No family history on file.  Social History Social History   Tobacco Use  . Smoking status:  Never Smoker  . Smokeless tobacco: Never Used  Substance Use Topics  . Alcohol use: Yes    Comment: "very very little"  . Drug use: Never    Review of Systems Unable to obtain an adequate/accurate review of systems secondary to dementia ____________________________________________   PHYSICAL EXAM:  VITAL SIGNS: ED Triage Vitals [02/19/18 0720]  Enc Vitals Group     BP      Pulse Rate 76     Resp 20     Temp 97.7 F (36.5 C)     Temp Source Oral     SpO2 97 %     Weight      Height      Head Circumference      Peak Flow      Pain Score      Pain Loc      Pain Edu?      Excl. in GC?    Constitutional: Awake alert, pleasant.  No apparent distress. Eyes: Normal  exam ENT   Head: Normocephalic and atraumatic.   Mouth/Throat: Mucous membranes are moist. Cardiovascular: Normal rate, regular rhythm. Respiratory: Normal respiratory effort without tachypnea nor retractions. Breath sounds are clear Gastrointestinal: Soft and nontender. No distention Musculoskeletal: Nontender with normal range of motion in all extremities.  Neurologic:  Normal speech and language. No gross focal neurologic deficits  Skin:  Skin is warm, dry and intact.  Psychiatric: Calm and pleasant.  ____________________________________________    EKG  EKG viewed and interpreted by myself shows atrial fibrillation at 77 bpm with a narrow QRS, normal axis, normal intervals, nonspecific ST changes.  ____________________________________________    RADIOLOGY  CT head is negative. Chest x-ray does not appear to show any acute abnormality although possible mild degree of CHF.  ____________________________________________   INITIAL IMPRESSION / ASSESSMENT AND PLAN / ED COURSE  Pertinent labs & imaging results that were available during my care of the patient were reviewed by me and considered in my medical decision making (see chart for details).  Patient presents to the emergency department for an unwitnessed fall and increased confusion over the past 3 days.  Differential would include infectious etiologies such as urinary tract infection, metabolic abnormality, ICH, progressing dementia.  We will check labs obtain a CT scan of the head as well as a urine sample and continue to closely monitor.  Currently the patient appears overall well, no distress he is calm cooperative and pleasant at this time.  Patient's work-up showed elevated white blood cell count however is otherwise largely normal.  Urinalysis has resulted normal, chest x-ray does not appear to show any consolidation.  CT scan head is normal.  Daughter is here with the patient.  Overall the patient appears well.   We will discharge back to his nursing facility and have him follow-up with his primary care doctor.  Daughter agreeable to plan of care.  ____________________________________________   FINAL CLINICAL IMPRESSION(S) / ED DIAGNOSES  Fall Confusion    Minna AntisPaduchowski, Rewa Weissberg, MD 02/19/18 1029

## 2018-02-19 NOTE — ED Notes (Signed)
Pt brief changed, pt family at bedside and updated on pt status

## 2018-02-19 NOTE — ED Notes (Signed)
[facility] called for transport back to [facility]

## 2019-03-25 ENCOUNTER — Other Ambulatory Visit: Payer: Self-pay

## 2019-03-25 ENCOUNTER — Inpatient Hospital Stay
Admission: EM | Admit: 2019-03-25 | Discharge: 2019-04-04 | DRG: 871 | Disposition: A | Discharge status: Nursing Home (Includes ICF) | Payer: Medicare Other | Source: Skilled Nursing Facility | Attending: Internal Medicine | Admitting: Internal Medicine

## 2019-03-25 ENCOUNTER — Emergency Department: Payer: Medicare Other

## 2019-03-25 DIAGNOSIS — Z6834 Body mass index (BMI) 34.0-34.9, adult: Secondary | ICD-10-CM

## 2019-03-25 DIAGNOSIS — A4189 Other specified sepsis: Principal | ICD-10-CM | POA: Diagnosis present

## 2019-03-25 DIAGNOSIS — J9601 Acute respiratory failure with hypoxia: Secondary | ICD-10-CM

## 2019-03-25 DIAGNOSIS — E669 Obesity, unspecified: Secondary | ICD-10-CM | POA: Diagnosis present

## 2019-03-25 DIAGNOSIS — Z79890 Hormone replacement therapy (postmenopausal): Secondary | ICD-10-CM

## 2019-03-25 DIAGNOSIS — F0390 Unspecified dementia without behavioral disturbance: Secondary | ICD-10-CM

## 2019-03-25 DIAGNOSIS — U071 COVID-19: Secondary | ICD-10-CM

## 2019-03-25 DIAGNOSIS — Z7901 Long term (current) use of anticoagulants: Secondary | ICD-10-CM

## 2019-03-25 DIAGNOSIS — K219 Gastro-esophageal reflux disease without esophagitis: Secondary | ICD-10-CM | POA: Diagnosis present

## 2019-03-25 DIAGNOSIS — I10 Essential (primary) hypertension: Secondary | ICD-10-CM

## 2019-03-25 DIAGNOSIS — M1990 Unspecified osteoarthritis, unspecified site: Secondary | ICD-10-CM | POA: Diagnosis present

## 2019-03-25 DIAGNOSIS — H409 Unspecified glaucoma: Secondary | ICD-10-CM | POA: Diagnosis present

## 2019-03-25 DIAGNOSIS — E119 Type 2 diabetes mellitus without complications: Secondary | ICD-10-CM

## 2019-03-25 DIAGNOSIS — J690 Pneumonitis due to inhalation of food and vomit: Secondary | ICD-10-CM | POA: Diagnosis present

## 2019-03-25 DIAGNOSIS — A419 Sepsis, unspecified organism: Secondary | ICD-10-CM

## 2019-03-25 DIAGNOSIS — R609 Edema, unspecified: Secondary | ICD-10-CM

## 2019-03-25 DIAGNOSIS — Z8673 Personal history of transient ischemic attack (TIA), and cerebral infarction without residual deficits: Secondary | ICD-10-CM

## 2019-03-25 DIAGNOSIS — F05 Delirium due to known physiological condition: Secondary | ICD-10-CM | POA: Diagnosis present

## 2019-03-25 DIAGNOSIS — Z66 Do not resuscitate: Secondary | ICD-10-CM | POA: Diagnosis present

## 2019-03-25 DIAGNOSIS — G92 Toxic encephalopathy: Secondary | ICD-10-CM | POA: Diagnosis present

## 2019-03-25 DIAGNOSIS — Z794 Long term (current) use of insulin: Secondary | ICD-10-CM

## 2019-03-25 DIAGNOSIS — Z79899 Other long term (current) drug therapy: Secondary | ICD-10-CM

## 2019-03-25 DIAGNOSIS — J1282 Pneumonia due to coronavirus disease 2019: Secondary | ICD-10-CM | POA: Diagnosis present

## 2019-03-25 DIAGNOSIS — L03115 Cellulitis of right lower limb: Secondary | ICD-10-CM | POA: Diagnosis present

## 2019-03-25 DIAGNOSIS — A411 Sepsis due to other specified staphylococcus: Secondary | ICD-10-CM | POA: Diagnosis present

## 2019-03-25 DIAGNOSIS — Z888 Allergy status to other drugs, medicaments and biological substances status: Secondary | ICD-10-CM

## 2019-03-25 DIAGNOSIS — E1165 Type 2 diabetes mellitus with hyperglycemia: Secondary | ICD-10-CM | POA: Diagnosis present

## 2019-03-25 DIAGNOSIS — G9341 Metabolic encephalopathy: Secondary | ICD-10-CM

## 2019-03-25 LAB — COMPREHENSIVE METABOLIC PANEL
ALT: 20 U/L (ref 0–44)
AST: 26 U/L (ref 15–41)
Albumin: 3.1 g/dL — ABNORMAL LOW (ref 3.5–5.0)
Alkaline Phosphatase: 89 U/L (ref 38–126)
Anion gap: 12 (ref 5–15)
BUN: 21 mg/dL (ref 8–23)
CO2: 27 mmol/L (ref 22–32)
Calcium: 9 mg/dL (ref 8.9–10.3)
Chloride: 104 mmol/L (ref 98–111)
Creatinine, Ser: 1.08 mg/dL (ref 0.61–1.24)
GFR calc Af Amer: >60 mL/min (ref >60)
GFR calc non Af Amer: >60 mL/min (ref >60)
Glucose, Bld: 217 mg/dL — ABNORMAL HIGH (ref 70–99)
Potassium: 4.3 mmol/L (ref 3.5–5.1)
Sodium: 143 mmol/L (ref 135–145)
Total Bilirubin: 0.9 mg/dL (ref 0.3–1.2)
Total Protein: 7.7 g/dL (ref 6.5–8.1)

## 2019-03-25 LAB — CBC WITH DIFFERENTIAL/PLATELET
Abs Immature Granulocytes: 0.13 10*3/uL — ABNORMAL HIGH (ref 0.00–0.07)
Basophils Absolute: 0 10*3/uL (ref 0.0–0.1)
Basophils Relative: 0 %
Eosinophils Absolute: 0 10*3/uL (ref 0.0–0.5)
Eosinophils Relative: 0 %
HCT: 44.4 % (ref 39.0–52.0)
Hemoglobin: 14.1 g/dL (ref 13.0–17.0)
Immature Granulocytes: 2 %
Lymphocytes Relative: 12 %
Lymphs Abs: 1 10*3/uL (ref 0.7–4.0)
MCH: 29 pg (ref 26.0–34.0)
MCHC: 31.8 g/dL (ref 30.0–36.0)
MCV: 91.2 fL (ref 80.0–100.0)
Monocytes Absolute: 0.7 10*3/uL (ref 0.1–1.0)
Monocytes Relative: 8 %
Neutro Abs: 6.5 10*3/uL (ref 1.7–7.7)
Neutrophils Relative %: 78 %
Platelets: 220 10*3/uL (ref 150–400)
RBC: 4.87 MIL/uL (ref 4.22–5.81)
RDW: 13 % (ref 11.5–15.5)
WBC: 8.3 10*3/uL (ref 4.0–10.5)
nRBC: 0 % (ref 0.0–0.2)

## 2019-03-25 LAB — PROTIME-INR
INR: 1.1 (ref 0.8–1.2)
Prothrombin Time: 14.3 seconds (ref 11.4–15.2)

## 2019-03-25 LAB — LACTIC ACID, PLASMA: Lactic Acid, Venous: 2.7 mmol/L (ref 0.5–1.9)

## 2019-03-25 LAB — APTT: aPTT: 34 seconds (ref 24–36)

## 2019-03-25 LAB — POC SARS CORONAVIRUS 2 AG: SARS Coronavirus 2 Ag: NEGATIVE

## 2019-03-25 MED ORDER — VANCOMYCIN HCL IN DEXTROSE 1-5 GM/200ML-% IV SOLN
1000.0000 mg | Freq: Once | INTRAVENOUS | Status: AC
  Administered 2019-03-26: 1000 mg via INTRAVENOUS
  Filled 2019-03-25: qty 200

## 2019-03-25 MED ORDER — METRONIDAZOLE IN NACL 5-0.79 MG/ML-% IV SOLN
500.0000 mg | Freq: Once | INTRAVENOUS | Status: AC
  Administered 2019-03-25: 23:00:00 500 mg via INTRAVENOUS
  Filled 2019-03-25: qty 100

## 2019-03-25 MED ORDER — SODIUM CHLORIDE 0.9 % IV BOLUS
1000.0000 mL | Freq: Once | INTRAVENOUS | Status: AC
  Administered 2019-03-26: 1000 mL via INTRAVENOUS

## 2019-03-25 MED ORDER — SODIUM CHLORIDE 0.9 % IV SOLN
2.0000 g | Freq: Once | INTRAVENOUS | Status: AC
  Administered 2019-03-25: 23:00:00 2 g via INTRAVENOUS
  Filled 2019-03-25: qty 2

## 2019-03-25 NOTE — Progress Notes (Signed)
PHARMACY -  BRIEF ANTIBIOTIC NOTE   Pharmacy has received consult(s) for Cefepime and Vancomycin from an ED provider.  The patient's profile has been reviewed for ht/wt/allergies/indication/available labs.    One time order(s) placed for Cefepime 2gm and Vancomycin 2gm (1gm bag + 1gm bag)  Further antibiotics/pharmacy consults should be ordered by admitting physician if indicated.                       Thank you, Wayland Denis 03/25/2019  10:27 PM

## 2019-03-25 NOTE — ED Provider Notes (Addendum)
[facility] Emergency Department Provider Note  ____________________________________________   First MD Initiated Contact with Patient 03/25/19 2222     (approximate)  I have reviewed the triage vital signs and the nursing notes.   HISTORY  Chief Complaint Altered Mental Status    HPI Max [name] is a 86 y.o. male  With h/ DM, HTN, dementia, here with AMS. Pt reportedly was slumped over, breathing quickly at his facility. He reportedly had last been seen normal "a few hours ago." On EMS arrival, pt tachypneic, hypoxic, minimally responsive. Has become more responsive on O2. Unable to provide any further detail. Facility declined any fall or recent fevers.  Level 5 caveat invoked as remainder of history, ROS, and physical exam limited due to patient's dementia.        Past Medical History:  Diagnosis Date   Arthritis    Diabetes mellitus without complication (HCC)    Glaucoma    Hypertension     There are no problems to display for this patient.   Past Surgical History:  Procedure Laterality Date   APPENDECTOMY      Prior to Admission medications   Medication Sig Start Date End Date Taking? Authorizing Provider  acetaminophen (TYLENOL) 325 MG tablet Take 650 mg by mouth every 4 (four) hours as needed.    Yes [provider]  bisacodyl (DULCOLAX) 5 MG EC tablet Take 10 mg by mouth daily as needed.    Yes [provider]  brimonidine-timolol (COMBIGAN) 0.2-0.5 % ophthalmic solution Place 1 drop into the right eye 2 (two) times daily.   Yes [provider]  Cholecalciferol 25 MCG (1000 UT) tablet Take 2,000 Units by mouth daily.   Yes [provider]  cycloSPORINE (RESTASIS) 0.05 % ophthalmic emulsion Place 1 drop into the right eye 2 (two) times daily.   Yes [provider]  donepezil (ARICEPT) 10 MG tablet Take 10 mg by mouth at bedtime.  12/31/17  Yes [provider]  DULoxetine  (CYMBALTA) 60 MG capsule Take 60 mg by mouth daily.   Yes [provider]  ELIQUIS 2.5 MG TABS tablet Take 2.5 mg by mouth 2 (two) times daily. 03/16/19  Yes [provider]  fluticasone (FLONASE) 50 MCG/ACT nasal spray Place 2 sprays into both nostrils daily as needed.    Yes [provider]  glipiZIDE (GLUCOTROL XL) 10 MG 24 hr tablet Take 20 mg by mouth daily with breakfast.   Yes [provider]  Glucagon (BAQSIMI ONE PACK) 3 MG/DOSE POWD Place 1 spray into the nose as needed. When sugar is <55   Yes [provider]  guaiFENesin (MUCINEX) 600 MG 12 hr tablet Take 600 mg by mouth 2 (two) times daily. 03/15/19 03/30/19 Yes [provider]  insulin glargine (LANTUS) 100 UNIT/ML injection Inject 15 Units into the skin 2 (two) times daily.    Yes [provider]  latanoprost (XALATAN) 0.005 % ophthalmic solution Place 1 drop into the right eye at bedtime.   Yes [provider]  loperamide (IMODIUM) 2 MG capsule Take 2 mg by mouth every 6 (six) hours as needed for diarrhea or loose stools.   Yes [provider]  losartan (COZAAR) 100 MG tablet Take 100 mg by mouth daily.   Yes [provider]  Melatonin 3 MG TABS Take 6 mg by mouth at bedtime. 03/24/19 04/03/19 Yes [provider]  memantine (NAMENDA) 10 MG tablet Take 10 mg by mouth 2 (  two) times daily. 03/07/19  Yes [provider]  mirabegron ER (MYRBETRIQ) 50 MG TB24 tablet Take 50 mg by mouth daily.   Yes [provider]  Multiple Vitamin (MULTIVITAMIN WITH MINERALS) TABS tablet Take 1 tablet by mouth daily.   Yes [provider]  NOVOLOG FLEXPEN 100 UNIT/ML FlexPen Inject 2-14 Units into the skin See admin instructions. Sliding Scale Insulin: If 0 - 150 = 0; 151 - 200 = 2u;  201 - 250 = 4u; 251 - 300 = 6u; 301 - 350 = 8 u; 351 - 400 = 10u; 401 - 450 = 12u; 451 - 500 = 14u subcutaneous before meals for DM>500 call [facility] 01/17/19  Yes  [provider]  nystatin (NYSTATIN) powder Apply 1 application topically See admin instructions. Apply to abdominal folds topically one time a day for redness until rash healed.   Yes [provider]  nystatin cream (MYCOSTATIN) Apply 1 application topically See admin instructions. Apply topically to groin every 12 hours as needed for rash.   Yes [provider]  omeprazole (PRILOSEC) 20 MG capsule Take 20 mg by mouth daily. 03/07/19  Yes [provider]  pioglitazone (ACTOS) 15 MG tablet Take 15 mg by mouth daily.   Yes [provider]  sertraline (ZOLOFT) 50 MG tablet Take 50 mg by mouth daily. 03/07/19  Yes [provider]  traZODone (DESYREL) 150 MG tablet Take 75 mg by mouth at bedtime. 03/07/19  Yes [provider]  ascorbic acid (VITAMIN C) 500 MG tablet Take 500 mg by mouth daily.    [provider]  mirtazapine (REMERON) 7.5 MG tablet Take 7.5 mg by mouth at bedtime.    [provider]  ranitidine (ZANTAC) 150 MG tablet Take 150 mg by mouth daily.     [provider]  solifenacin (VESICARE) 10 MG tablet Take 10 mg by mouth daily.    [provider]  vitamin B-12 (CYANOCOBALAMIN) 1000 MCG tablet Take 1,000 mcg by mouth daily.    [provider]  Zinc 50 MG TABS Take by mouth.    [provider]    Allergies Metformin and related  No family history on file.  Social History Social History   Tobacco Use   Smoking status: Never Smoker   Smokeless tobacco: Never Used  Substance Use Topics   Alcohol use: Yes    Comment: "very very little"   Drug use: Never    Review of Systems  Review of Systems  Unable to perform ROS: Dementia     ____________________________________________  PHYSICAL EXAM:      VITAL SIGNS: ED Triage Vitals [03/25/19 2200]  Enc Vitals Group     BP (!) 163/93     Pulse Rate (!) 113     Resp (!) 32     Temp      Temp src      SpO2 (!)  88 %     Weight 280 lb (127 kg)     Height 5\' 11"  (1.803 m)     Head Circumference      Peak Flow      Pain Score      Pain Loc      Pain Edu?      Excl. in GC?      Physical Exam Vitals and nursing note reviewed.  Constitutional:      General: He is not in acute distress.    Appearance: He is well-developed. He is ill-appearing.  HENT:     Head: Normocephalic and atraumatic.  Eyes:     Conjunctiva/sclera: Conjunctivae normal.  Cardiovascular:     Rate and Rhythm: Regular rhythm. Tachycardia present.     Heart sounds: Normal heart sounds.  Pulmonary:     Effort: Tachypnea and respiratory distress present.     Breath sounds: Decreased air movement present. Examination of the right-lower field reveals rales. Examination of the left-lower field reveals rales. Rales present. No wheezing.  Abdominal:     General: There is no distension.  Musculoskeletal:     Cervical back: Neck supple.  Skin:    General: Skin is warm.     Capillary Refill: Capillary refill takes less than 2 seconds.     Findings: No rash.  Neurological:     Mental Status: He is alert and oriented to person, place, and time.     Motor: No abnormal muscle tone.       ____________________________________________   LABS (all labs ordered are listed, but only abnormal results are displayed)  Labs Reviewed  LACTIC ACID, PLASMA - Abnormal; Notable for the following components:      Result Value   Lactic Acid, Venous 2.7 (*)    All other components within normal limits  COMPREHENSIVE METABOLIC PANEL - Abnormal; Notable for the following components:   Glucose, Bld 217 (*)    Albumin 3.1 (*)    All other components within normal limits  CBC WITH DIFFERENTIAL/PLATELET - Abnormal; Notable for the following components:   Abs Immature Granulocytes 0.13 (*)    All other components within normal limits  CULTURE, BLOOD (ROUTINE X 2)  CULTURE, BLOOD (ROUTINE X 2)  URINE CULTURE  RESPIRATORY PANEL BY RT PCR (FLU  A&B, COVID)  APTT  PROTIME-INR  LACTIC ACID, PLASMA  URINALYSIS, ROUTINE W REFLEX MICROSCOPIC  BRAIN NATRIURETIC PEPTIDE  POC SARS CORONAVIRUS 2 AG -  ED  POC SARS CORONAVIRUS 2 AG  TROPONIN I (HIGH SENSITIVITY)    ____________________________________________  EKG: Sinus tachycardia, VR 112. QRS 81, QTc 428. PVCs. RAD. No acute ST elevations or depression. ________________________________________  RADIOLOGY All imaging, including plain films, CT scans, and ultrasounds, independently reviewed by me, and interpretations confirmed via formal radiology reads.  ED MD interpretation:   CXR: Bibasilar infiltrates, cardiomegaly  Official radiology report(s): DG Chest Port 1 View  Result Date: 03/25/2019 CLINICAL DATA:  86 year old male with sepsis. EXAM: PORTABLE CHEST 1 VIEW COMPARISON:  Chest radiograph dated 02/19/2018 FINDINGS: There is shallow inspiration. Cardiomegaly with vascular congestion. Bilateral lower lung field peripheral and subpleural hazy densities may represent atelectasis although developing infiltrate not excluded. Clinical correlation is recommended. No large pleural effusion or pneumothorax. No acute osseous pathology. IMPRESSION: 1. Cardiomegaly with vascular congestion. 2. Bilateral lower lung field subpleural atelectasis versus developing infiltrate. Clinical correlation is recommended. Electronically Signed   By: Anner Crete M.D.   On: 03/25/2019 22:56    ____________________________________________  PROCEDURES   Procedure(s) performed (including Critical Care):  .Critical Care Performed by: Duffy Bruce, MD Authorized by: Duffy Bruce, MD   Critical care provider statement:    Critical care time (minutes):  35   Critical care time was exclusive of:  Separately billable procedures and treating other patients and teaching time   Critical care was necessary to treat or prevent imminent or life-threatening deterioration of the following conditions:   Circulatory failure, cardiac failure and respiratory failure   Critical care was time spent personally by me on the following activities:  Development  of treatment plan with patient or surrogate, discussions with consultants, evaluation of patient's response to treatment, examination of patient, obtaining history from patient or surrogate, ordering and performing treatments and interventions, ordering and review of laboratory studies, ordering and review of radiographic studies, pulse oximetry, re-evaluation of patient's condition and review of old charts   I assumed direction of critical care for this patient from another provider in my specialty: no      ____________________________________________  INITIAL IMPRESSION / MDM / ASSESSMENT AND PLAN / ED COURSE  As part of my medical decision making, I reviewed the following data within the electronic MEDICAL RECORD NUMBER Nursing notes reviewed and incorporated, Old chart reviewed, Notes from prior ED visits, and  Controlled Substance Database       *Max [name] was evaluated in Emergency Department on 03/25/2019 for the symptoms described in the history of present illness. He was evaluated in the context of the global COVID-19 pandemic, which necessitated consideration that the patient might be at risk for infection with the SARS-CoV-2 virus that causes COVID-19. Institutional protocols and algorithms that pertain to the evaluation of patients at risk for COVID-19 are in a state of rapid change based on information released by regulatory bodies including the CDC and federal and state organizations. These policies and algorithms were followed during the patient's care in the ED.  Some ED evaluations and interventions may be delayed as a result of limited staffing during the pandemic.*     Medical Decision Making:  86 yo M here with acute hypoxic resp failure, AMS, reported fever. Suspect HAP, COVID-19/viral PNA, less likely CHF. Code sepsis  initiated on arrival w/ broad spectrum ABX. Cautious fluids given. Pt WOB and RR improving with O2. Admit to medicine. Rapid COVID neg. Labs show mild lactic acidosis but otherwise normal WBC, renal function, overall reassuring labs.  ____________________________________________  FINAL CLINICAL IMPRESSION(S) / ED DIAGNOSES  Final diagnoses:  Acute respiratory failure with hypoxia (HCC)  Sepsis with acute hypoxic respiratory failure without septic shock, due to unspecified organism (HCC)     MEDICATIONS GIVEN DURING THIS VISIT:  Medications  vancomycin (VANCOCIN) IVPB 1000 mg/200 mL premix (has no administration in time range)  vancomycin (VANCOCIN) IVPB 1000 mg/200 mL premix (has no administration in time range)  sodium chloride 0.9 % bolus 1,000 mL (has no administration in time range)  ceFEPIme (MAXIPIME) 2 g in sodium chloride 0.9 % 100 mL IVPB (2 g Intravenous New Bag/Given 03/25/19 2244)  metroNIDAZOLE (FLAGYL) IVPB 500 mg (500 mg Intravenous New Bag/Given 03/25/19 2244)     ED Discharge Orders    None       Note:  This document was prepared using Dragon voice recognition software and may include unintentional dictation errors.   Shaune Pollack, MD 03/25/19 Joseph Pierini    Shaune Pollack, MD 04/12/19 1540

## 2019-03-25 NOTE — ED Triage Notes (Signed)
Patient to [facility] via EMS from local nursing home.  Per EMS patient with altered from usual.  Per EMS patient has just been moved for quarintine back to regular unit and he was slumped forward.  Nsg staff told EMS patient had tympanic of 103, EMS got 100.1 axillary with room air pulse oxi of 88% and heart rate 110-120's.  Saline loc to left antecub with 18g angiocath.

## 2019-03-25 NOTE — Progress Notes (Signed)
CODE SEPSIS - PHARMACY COMMUNICATION  **Broad Spectrum Antibiotics should be administered within 1 hour of Sepsis diagnosis**  Time Code Sepsis Called/Page Received: 2224  Antibiotics Ordered: Vancomycin, Flagyl and Cefepime  Time of 1st antibiotic administration: 2244, Cefepime and Flagyl  Additional action taken by pharmacy: n/a  If necessary, Name of Provider/Nurse Contacted: n/a    Wayland Denis ,PharmD Clinical Pharmacist  03/25/2019  10:27 PM

## 2019-03-26 ENCOUNTER — Encounter: Payer: Self-pay | Admitting: Internal Medicine

## 2019-03-26 ENCOUNTER — Inpatient Hospital Stay: Payer: Medicare Other

## 2019-03-26 DIAGNOSIS — E119 Type 2 diabetes mellitus without complications: Secondary | ICD-10-CM

## 2019-03-26 DIAGNOSIS — H409 Unspecified glaucoma: Secondary | ICD-10-CM | POA: Diagnosis present

## 2019-03-26 DIAGNOSIS — E1165 Type 2 diabetes mellitus with hyperglycemia: Secondary | ICD-10-CM | POA: Diagnosis present

## 2019-03-26 DIAGNOSIS — A411 Sepsis due to other specified staphylococcus: Secondary | ICD-10-CM | POA: Diagnosis present

## 2019-03-26 DIAGNOSIS — Z8673 Personal history of transient ischemic attack (TIA), and cerebral infarction without residual deficits: Secondary | ICD-10-CM

## 2019-03-26 DIAGNOSIS — J9601 Acute respiratory failure with hypoxia: Secondary | ICD-10-CM

## 2019-03-26 DIAGNOSIS — U071 COVID-19: Secondary | ICD-10-CM | POA: Diagnosis present

## 2019-03-26 DIAGNOSIS — F05 Delirium due to known physiological condition: Secondary | ICD-10-CM | POA: Diagnosis present

## 2019-03-26 DIAGNOSIS — R6520 Severe sepsis without septic shock: Secondary | ICD-10-CM | POA: Diagnosis not present

## 2019-03-26 DIAGNOSIS — G9341 Metabolic encephalopathy: Secondary | ICD-10-CM

## 2019-03-26 DIAGNOSIS — G9340 Encephalopathy, unspecified: Secondary | ICD-10-CM | POA: Diagnosis not present

## 2019-03-26 DIAGNOSIS — M1990 Unspecified osteoarthritis, unspecified site: Secondary | ICD-10-CM | POA: Diagnosis present

## 2019-03-26 DIAGNOSIS — F0390 Unspecified dementia without behavioral disturbance: Secondary | ICD-10-CM | POA: Diagnosis present

## 2019-03-26 DIAGNOSIS — Z888 Allergy status to other drugs, medicaments and biological substances status: Secondary | ICD-10-CM | POA: Diagnosis not present

## 2019-03-26 DIAGNOSIS — A419 Sepsis, unspecified organism: Secondary | ICD-10-CM

## 2019-03-26 DIAGNOSIS — I10 Essential (primary) hypertension: Secondary | ICD-10-CM

## 2019-03-26 DIAGNOSIS — A4189 Other specified sepsis: Secondary | ICD-10-CM | POA: Diagnosis present

## 2019-03-26 DIAGNOSIS — Z66 Do not resuscitate: Secondary | ICD-10-CM | POA: Diagnosis present

## 2019-03-26 DIAGNOSIS — Z7901 Long term (current) use of anticoagulants: Secondary | ICD-10-CM | POA: Diagnosis not present

## 2019-03-26 DIAGNOSIS — J1282 Pneumonia due to coronavirus disease 2019: Secondary | ICD-10-CM | POA: Diagnosis present

## 2019-03-26 DIAGNOSIS — Z794 Long term (current) use of insulin: Secondary | ICD-10-CM

## 2019-03-26 DIAGNOSIS — G92 Toxic encephalopathy: Secondary | ICD-10-CM | POA: Diagnosis present

## 2019-03-26 DIAGNOSIS — R7881 Bacteremia: Secondary | ICD-10-CM | POA: Diagnosis not present

## 2019-03-26 DIAGNOSIS — Z6834 Body mass index (BMI) 34.0-34.9, adult: Secondary | ICD-10-CM | POA: Diagnosis not present

## 2019-03-26 DIAGNOSIS — E669 Obesity, unspecified: Secondary | ICD-10-CM | POA: Diagnosis present

## 2019-03-26 DIAGNOSIS — L089 Local infection of the skin and subcutaneous tissue, unspecified: Secondary | ICD-10-CM | POA: Diagnosis not present

## 2019-03-26 DIAGNOSIS — J690 Pneumonitis due to inhalation of food and vomit: Secondary | ICD-10-CM | POA: Diagnosis present

## 2019-03-26 DIAGNOSIS — L03115 Cellulitis of right lower limb: Secondary | ICD-10-CM | POA: Diagnosis present

## 2019-03-26 DIAGNOSIS — B449 Aspergillosis, unspecified: Secondary | ICD-10-CM | POA: Diagnosis not present

## 2019-03-26 DIAGNOSIS — Z79890 Hormone replacement therapy (postmenopausal): Secondary | ICD-10-CM | POA: Diagnosis not present

## 2019-03-26 DIAGNOSIS — Z79899 Other long term (current) drug therapy: Secondary | ICD-10-CM | POA: Diagnosis not present

## 2019-03-26 DIAGNOSIS — B957 Other staphylococcus as the cause of diseases classified elsewhere: Secondary | ICD-10-CM | POA: Diagnosis not present

## 2019-03-26 DIAGNOSIS — K219 Gastro-esophageal reflux disease without esophagitis: Secondary | ICD-10-CM | POA: Diagnosis present

## 2019-03-26 LAB — GLUCOSE, CAPILLARY
Glucose-Capillary: 181 mg/dL — ABNORMAL HIGH (ref 70–99)
Glucose-Capillary: 206 mg/dL — ABNORMAL HIGH (ref 70–99)
Glucose-Capillary: 193 mg/dL — ABNORMAL HIGH (ref 70–99)
Glucose-Capillary: 187 mg/dL — ABNORMAL HIGH (ref 70–99)
Glucose-Capillary: 182 mg/dL — ABNORMAL HIGH (ref 70–99)
Glucose-Capillary: 163 mg/dL — ABNORMAL HIGH (ref 70–99)

## 2019-03-26 LAB — URINALYSIS, ROUTINE W REFLEX MICROSCOPIC
Bilirubin Urine: NEGATIVE
Glucose, UA: 50 mg/dL — AB
Hgb urine dipstick: NEGATIVE
Ketones, ur: 5 mg/dL — AB
Leukocytes,Ua: NEGATIVE
Nitrite: NEGATIVE
Protein, ur: NEGATIVE mg/dL
Specific Gravity, Urine: 1.024 (ref 1.005–1.030)
pH: 5 (ref 5.0–8.0)

## 2019-03-26 LAB — TROPONIN I (HIGH SENSITIVITY)
Troponin I (High Sensitivity): 50 ng/L — ABNORMAL HIGH (ref <18)
Troponin I (High Sensitivity): 51 ng/L — ABNORMAL HIGH (ref <18)

## 2019-03-26 LAB — RESPIRATORY PANEL BY RT PCR (FLU A&B, COVID)
Influenza A by PCR: NEGATIVE
Influenza B by PCR: NEGATIVE
SARS Coronavirus 2 by RT PCR: POSITIVE — AB

## 2019-03-26 LAB — PROTIME-INR
INR: 1.1 (ref 0.8–1.2)
Prothrombin Time: 14.5 seconds (ref 11.4–15.2)

## 2019-03-26 LAB — C-REACTIVE PROTEIN: CRP: 16.9 mg/dL — ABNORMAL HIGH (ref <1.0)

## 2019-03-26 LAB — MAGNESIUM: Magnesium: 2 mg/dL (ref 1.7–2.4)

## 2019-03-26 LAB — CBC
HCT: 41.4 % (ref 39.0–52.0)
Hemoglobin: 13.2 g/dL (ref 13.0–17.0)
MCH: 29.5 pg (ref 26.0–34.0)
MCHC: 31.9 g/dL (ref 30.0–36.0)
MCV: 92.6 fL (ref 80.0–100.0)
Platelets: 210 10*3/uL (ref 150–400)
RBC: 4.47 MIL/uL (ref 4.22–5.81)
RDW: 13 % (ref 11.5–15.5)
WBC: 6.5 10*3/uL (ref 4.0–10.5)
nRBC: 0 % (ref 0.0–0.2)

## 2019-03-26 LAB — PROCALCITONIN: Procalcitonin: 0.32 ng/mL

## 2019-03-26 LAB — BRAIN NATRIURETIC PEPTIDE: B Natriuretic Peptide: 50 pg/mL (ref 0.0–100.0)

## 2019-03-26 LAB — AMMONIA: Ammonia: 11 umol/L (ref 9–35)

## 2019-03-26 LAB — HEMOGLOBIN A1C
Hgb A1c MFr Bld: 8.8 % — ABNORMAL HIGH (ref 4.8–5.6)
Mean Plasma Glucose: 205.86 mg/dL

## 2019-03-26 LAB — FIBRIN DERIVATIVES D-DIMER (ARMC ONLY): Fibrin derivatives D-dimer (ARMC): 818.22 ng/mL (FEU) — ABNORMAL HIGH (ref 0.00–499.00)

## 2019-03-26 LAB — CORTISOL-AM, BLOOD: Cortisol - AM: 12.8 ug/dL (ref 6.7–22.6)

## 2019-03-26 LAB — LACTIC ACID, PLASMA: Lactic Acid, Venous: 1.6 mmol/L (ref 0.5–1.9)

## 2019-03-26 MED ORDER — SODIUM CHLORIDE 0.9 % IV SOLN
500.0000 mg | INTRAVENOUS | Status: DC
  Administered 2019-03-26: 09:00:00 500 mg via INTRAVENOUS
  Filled 2019-03-26 (×2): qty 500

## 2019-03-26 MED ORDER — SODIUM CHLORIDE 0.9 % IV SOLN: INTRAVENOUS | Status: DC

## 2019-03-26 MED ORDER — ONDANSETRON HCL 4 MG/2ML IJ SOLN: 4.0000 mg | Freq: Four times a day (QID) | INTRAMUSCULAR | Status: DC | PRN

## 2019-03-26 MED ORDER — INSULIN ASPART 100 UNIT/ML ~~LOC~~ SOLN
0.0000 [IU] | SUBCUTANEOUS | Status: DC
  Administered 2019-03-26 (×2): 3 [IU] via SUBCUTANEOUS
  Administered 2019-03-26: 5 [IU] via SUBCUTANEOUS
  Administered 2019-03-27: 3 [IU] via SUBCUTANEOUS
  Administered 2019-03-27: 21:00:00 8 [IU] via SUBCUTANEOUS
  Administered 2019-03-27: 3 [IU] via SUBCUTANEOUS
  Administered 2019-03-28: 01:00:00 2 [IU] via SUBCUTANEOUS
  Filled 2019-03-26 (×8): qty 1

## 2019-03-26 MED ORDER — DEXAMETHASONE SODIUM PHOSPHATE 10 MG/ML IJ SOLN
10.0000 mg | Freq: Once | INTRAMUSCULAR | Status: AC
  Administered 2019-03-26: 10 mg via INTRAVENOUS
  Filled 2019-03-26: qty 1

## 2019-03-26 MED ORDER — CEFAZOLIN SODIUM-DEXTROSE 2-4 GM/100ML-% IV SOLN
2.0000 g | Freq: Three times a day (TID) | INTRAVENOUS | Status: DC
  Administered 2019-03-27: 01:00:00 2 g via INTRAVENOUS
  Filled 2019-03-26 (×6): qty 100

## 2019-03-26 MED ORDER — PIPERACILLIN-TAZOBACTAM 3.375 G IVPB
3.3750 g | Freq: Three times a day (TID) | INTRAVENOUS | Status: DC
  Administered 2019-03-26 – 2019-03-27 (×4): 3.375 g via INTRAVENOUS
  Filled 2019-03-26 (×4): qty 50

## 2019-03-26 MED ORDER — DEXAMETHASONE SODIUM PHOSPHATE 10 MG/ML IJ SOLN
6.0000 mg | INTRAMUSCULAR | Status: DC
  Administered 2019-03-26 – 2019-04-03 (×9): 6 mg via INTRAVENOUS
  Filled 2019-03-26 (×9): qty 1

## 2019-03-26 MED ORDER — SENNOSIDES-DOCUSATE SODIUM 8.6-50 MG PO TABS: 1.0000 | ORAL_TABLET | Freq: Every evening | ORAL | Status: DC | PRN

## 2019-03-26 MED ORDER — ACETAMINOPHEN 650 MG RE SUPP: 650.0000 mg | Freq: Four times a day (QID) | RECTAL | Status: DC | PRN

## 2019-03-26 MED ORDER — INSULIN ASPART 100 UNIT/ML ~~LOC~~ SOLN
0.0000 [IU] | SUBCUTANEOUS | Status: DC
  Filled 2019-03-26: qty 1

## 2019-03-26 MED ORDER — SODIUM CHLORIDE 0.9 % IV SOLN
100.0000 mg | Freq: Every day | INTRAVENOUS | Status: AC
  Administered 2019-03-27 – 2019-03-30 (×4): 100 mg via INTRAVENOUS
  Filled 2019-03-26 (×4): qty 20

## 2019-03-26 MED ORDER — SODIUM CHLORIDE 0.9 % IV SOLN: 2.0000 g | INTRAVENOUS | Status: DC

## 2019-03-26 MED ORDER — PIPERACILLIN-TAZOBACTAM 3.375 G IVPB 30 MIN: 3.3750 g | Freq: Three times a day (TID) | INTRAVENOUS | Status: DC

## 2019-03-26 MED ORDER — ACETAMINOPHEN 325 MG PO TABS: 650.0000 mg | ORAL_TABLET | Freq: Four times a day (QID) | ORAL | Status: DC | PRN

## 2019-03-26 MED ORDER — SODIUM CHLORIDE 0.9 % IV SOLN
200.0000 mg | Freq: Once | INTRAVENOUS | Status: AC
  Administered 2019-03-26: 04:00:00 200 mg via INTRAVENOUS
  Filled 2019-03-26: qty 40

## 2019-03-26 MED ORDER — ONDANSETRON HCL 4 MG PO TABS: 4.0000 mg | ORAL_TABLET | Freq: Four times a day (QID) | ORAL | Status: DC | PRN

## 2019-03-26 NOTE — Progress Notes (Signed)
Remdesivir - Pharmacy Brief Note   O:  ALT: 20 CXR: evidence of infection SpO2: 100% on [place]   A/P:  Remdesivir 200 mg IVPB once followed by 100 mg IVPB daily x 4 days.   Valrie Hart, PharmD Clinical Pharmacist   03/26/2019 1:27 AM

## 2019-03-26 NOTE — Plan of Care (Signed)
Treat Infection and ensure pt safety through out hospital stay

## 2019-03-26 NOTE — Progress Notes (Signed)
Initial Nutrition Assessment  DOCUMENTATION CODES:   Obesity unspecified  INTERVENTION:  Upon diet advancement and if appropriate to diet order, recommend: -Ensure Enlive po BID, each supplement provides 350 kcal and 20 grams of protein -Vital Cuisine po daily, each supplement provides 520 kcal and 22 grams of protein -Magic cup BID with lunch and dinner meals, each supplement provides 290 kcal and 9 grams of protein -MVI with minerals daily   NUTRITION DIAGNOSIS:   Increased nutrient needs related to catabolic illness(sepsis secondary to pneumonia due to COVID-19 virus) as evidenced by estimated needs.    GOAL:   Patient will meet greater than or equal to 90% of their needs    MONITOR:   Diet advancement, Labs, I & O's  REASON FOR ASSESSMENT:   Malnutrition Screening Tool    ASSESSMENT:  RD working remotely.  86 year old male with past medical history of dementia, IDDM, HTN, GERD, h/o TIA, diagnosed with Covid-19 approximately 2 weeks ago presented via EMS with altered mental status. In ED, pt very lethargic, low-grade temperature of 100, Covid PCR positive, CXR showed cardiomegaly with vascular congestion and admitted with sepsis secondary to pneumonia due to COVID-19 virus.  Per notes, pt sleepy and arousable to command only, maintaining sats in the low 90s on room air.   Patient currently NPO. Suspect patient with poor po intake prior to admission secondary to Covid-19 pneumonia and is at risk for acute malnutrition. Will continue to monitor for diet advancement and provide nutrition supplements when appropriate.   Noted mild pitting BLE edema per RN flowsheet.  Current wt 113 kg (248.6 lbs) No recent weight history for review, last wt prior to admission 117.9 kg (June 2019)  Medications reviewed and include: Decadron, SS novolg, Zithromax, Zosyn, Remdesivir  IVF: NaCl @ 100 ml/hr  Labs: CBGs 193,206  Lab Results  Component Value Date   HGBA1C 8.8 (H)  03/26/2019     NUTRITION - FOCUSED PHYSICAL EXAM: Unable to complete at this time, RD working remotely.  Diet Order:   Diet Order            Diet NPO time specified  Diet effective now              EDUCATION NEEDS:   No education needs have been identified at this time  Skin:  Skin Assessment: Reviewed RN Assessment  Last BM:  1/23 (bowel incontinence per chart)  Height:   Ht Readings from Last 1 Encounters:  03/25/19 5\' 11"  (1.803 m)    Weight:   Wt Readings from Last 1 Encounters:  03/26/19 113 kg    Ideal Body Weight:  78.2 kg  BMI:  Body mass index is 34.75 kg/m.  Estimated Nutritional Needs:   Kcal:  2300-2600  Protein:  136-156  Fluid:  > 2 L/day   03/28/19, RD, LDN Clinical Nutrition  [facility] Telephone [phone] After Hours/Weekend Pager: [phone]

## 2019-03-26 NOTE — Progress Notes (Signed)
Sepsis originally called at 22:24 and then again at 01:17.

## 2019-03-26 NOTE — H&P (Signed)
History and Physical    Max [name] YQM:[id number] DOB: 1929/09/25 DOA: 03/25/2019  PCP: [facility]   Patient coming from: Assisted living facility, memory care unit  I have personally briefly reviewed patient's old medical records in [facility]  Chief Complaint: Altered mental status  HPI: Max [name] is a 86 y.o. male with medical history significant for dementia, insulin requiring type 2 diabetes, hypertension, GERD with history of TIA who was brought into the emergency room by EMS with altered mental status.  His last known normal was "a few hours' prior to arrival.  History is limited due to altered mental status.  Spoke with patient's daughter over the telephone but she said she had not seen him in quite a while because of Covid but did report that he was having some stomach discomfort.  Also reports that patient was diagnosed with Covid 2 weeks prior, but was mostly asymptomatic except for an episode of diarrhea 3 days after his diagnosis and she states he was cleared and taken out of the Covid isolation unit the day prior to arrival to the ER.  ED Course: On arrival in the emergency room patient was very lethargic.  He had a low-grade temperature of 100, blood pressure of 163/93, tachycardic at 113 and tachypneic at 32 with O2 sat 88% on room air.  His white cell count was 8000.  Lactic acid normal at 1.6.  Covid PCR was positive, influenza negative, procalcitonin pending.  Troponin was mildly elevated at 50.  His other labs are for the most part unremarkable.  Chest x-ray showed IMPRESSION: 1. Cardiomegaly with vascular congestion. 2. Bilateral lower lung field subpleural atelectasis versus developing infiltrate. Clinical correlation is recommended Patient was deemed to be septic and started on an IV fluid bolus along with cefepime metronidazole and vancomycin.  Hospitalist consulted for admission  Review of Systems: Unable to obtain due to the altered mental  status  Past Medical History:  Diagnosis Date  . Arthritis   . Diabetes mellitus without complication (HCC)   . Glaucoma   . Hypertension     Past Surgical History:  Procedure Laterality Date  . APPENDECTOMY       reports that he has never smoked. He has never used smokeless tobacco. He reports current alcohol use. He reports that he does not use drugs.  Allergies  Allergen Reactions  . Metformin And Related     No family history on file.   Prior to Admission medications   Medication Sig Start Date End Date Taking? Authorizing Provider  acetaminophen (TYLENOL) 325 MG tablet Take 650 mg by mouth every 4 (four) hours as needed.    Yes [provider]  bisacodyl (DULCOLAX) 5 MG EC tablet Take 10 mg by mouth daily as needed.    Yes [provider]  brimonidine-timolol (COMBIGAN) 0.2-0.5 % ophthalmic solution Place 1 drop into the right eye 2 (two) times daily.   Yes [provider]  Cholecalciferol 25 MCG (1000 UT) tablet Take 2,000 Units by mouth daily.   Yes [provider]  cycloSPORINE (RESTASIS) 0.05 % ophthalmic emulsion Place 1 drop into the right eye 2 (two) times daily.   Yes [provider]  donepezil (ARICEPT) 10 MG tablet Take 10 mg by mouth at bedtime.  12/31/17  Yes [provider]  DULoxetine (CYMBALTA) 60 MG capsule Take 60 mg by mouth daily.   Yes [provider]  ELIQUIS 2.5 MG TABS tablet Take 2.5 mg by mouth  2 (two) times daily. 03/16/19  Yes [provider]  fluticasone (FLONASE) 50 MCG/ACT nasal spray Place 2 sprays into both nostrils daily as needed.    Yes [provider]  glipiZIDE (GLUCOTROL XL) 10 MG 24 hr tablet Take 20 mg by mouth daily with breakfast.   Yes [provider]  Glucagon (BAQSIMI ONE PACK) 3 MG/DOSE POWD Place 1 spray into the nose as needed. When sugar is <55   Yes [provider]  guaiFENesin (MUCINEX) 600 MG 12 hr tablet Take 600 mg by mouth  2 (two) times daily. 03/15/19 03/30/19 Yes [provider]  insulin glargine (LANTUS) 100 UNIT/ML injection Inject 15 Units into the skin 2 (two) times daily.    Yes [provider]  latanoprost (XALATAN) 0.005 % ophthalmic solution Place 1 drop into the right eye at bedtime.   Yes [provider]  loperamide (IMODIUM) 2 MG capsule Take 2 mg by mouth every 6 (six) hours as needed for diarrhea or loose stools.   Yes [provider]  losartan (COZAAR) 100 MG tablet Take 100 mg by mouth daily.   Yes [provider]  Melatonin 3 MG TABS Take 6 mg by mouth at bedtime. 03/24/19 04/03/19 Yes [provider]  memantine (NAMENDA) 10 MG tablet Take 10 mg by mouth 2 (two) times daily. 03/07/19  Yes [provider]  mirabegron ER (MYRBETRIQ) 50 MG TB24 tablet Take 50 mg by mouth daily.   Yes [provider]  Multiple Vitamin (MULTIVITAMIN WITH MINERALS) TABS tablet Take 1 tablet by mouth daily.   Yes [provider]  NOVOLOG FLEXPEN 100 UNIT/ML FlexPen Inject 2-14 Units into the skin See admin instructions. Sliding Scale Insulin: If 0 - 150 = 0; 151 - 200 = 2u;  201 - 250 = 4u; 251 - 300 = 6u; 301 - 350 = 8 u; 351 - 400 = 10u; 401 - 450 = 12u; 451 - 500 = 14u subcutaneous before meals for DM>500 call [facility] 01/17/19  Yes [provider]  nystatin (NYSTATIN) powder Apply 1 application topically See admin instructions. Apply to abdominal folds topically one time a day for redness until rash healed.   Yes [provider]  nystatin cream (MYCOSTATIN) Apply 1 application topically See admin instructions. Apply topically to groin every 12 hours as needed for rash.   Yes [provider]  omeprazole (PRILOSEC) 20 MG capsule Take 20 mg by mouth daily. 03/07/19  Yes [provider]  pioglitazone (ACTOS) 15 MG tablet Take 15 mg by mouth daily.   Yes [provider]  sertraline (ZOLOFT) 50 MG tablet Take 50 mg  by mouth daily. 03/07/19  Yes [provider]  traZODone (DESYREL) 150 MG tablet Take 75 mg by mouth at bedtime. 03/07/19  Yes [provider]  ascorbic acid (VITAMIN C) 500 MG tablet Take 500 mg by mouth daily.    [provider]  mirtazapine (REMERON) 7.5 MG tablet Take 7.5 mg by mouth at bedtime.    [provider]  ranitidine (ZANTAC) 150 MG tablet Take 150 mg by mouth daily.     [provider]  solifenacin (VESICARE) 10 MG tablet Take 10 mg by mouth daily.    [provider]  vitamin B-12 (CYANOCOBALAMIN) 1000 MCG tablet Take 1,000 mcg by mouth daily.    [provider]  Zinc 50 MG TABS Take by mouth.    [provider]    Physical Exam: Vitals:  03/25/19 2230 03/25/19 2300 03/26/19 0004 03/26/19 0029  BP: 128/81 139/75 (!) 146/83   Pulse: 100 (!) 56 (!) 105   Resp: (!) 26 (!) 25 (!) 22   Temp:    100 F (37.8 C)  TempSrc:    Rectal  SpO2: 91% 95% 98%   Weight:      Height:         Vitals:   03/25/19 2230 03/25/19 2300 03/26/19 0004 03/26/19 0029  BP: 128/81 139/75 (!) 146/83   Pulse: 100 (!) 56 (!) 105   Resp: (!) 26 (!) 25 (!) 22   Temp:    100 F (37.8 C)  TempSrc:    Rectal  SpO2: 91% 95% 98%   Weight:      Height:        Constitutional: lethargic, drowsy but arousable. In no distress Eyes: PERRL, lids and conjunctivae normal ENMT: Mucous membranes are moist.  Neck: normal, supple, no masses, no thyromegaly Respiratory: clear to auscultation bilaterally, no wheezing, no crackles. Normal respiratory effort. No accessory muscle use.  Cardiovascular: Regular rate and rhythm, no murmurs / rubs / gallops. No extremity edema. 2+ pedal pulses. No carotid bruits.  Abdomen: no tenderness, no masses palpated. No hepatosplenomegaly. Bowel sounds positive.  Musculoskeletal: no clubbing / cyanosis. No joint deformity upper and lower extremities.  Skin: no rashes, lesions, ulcers.  Neurologic: No gross  focal neurologic deficit. Psychiatric: unable to assess    Labs on Admission: I have personally reviewed following labs and imaging studies  CBC: Recent Labs  Lab 03/25/19 2214  WBC 8.3  NEUTROABS 6.5  HGB 14.1  HCT 44.4  MCV 91.2  PLT 696   Basic Metabolic Panel: Recent Labs  Lab 03/25/19 2214  NA 143  K 4.3  CL 104  CO2 27  GLUCOSE 217*  BUN 21  CREATININE 1.08  CALCIUM 9.0   GFR: Estimated Creatinine Clearance: 65.4 mL/min (by C-G formula based on SCr of 1.08 mg/dL). Liver Function Tests: Recent Labs  Lab 03/25/19 2214  AST 26  ALT 20  ALKPHOS 89  BILITOT 0.9  PROT 7.7  ALBUMIN 3.1*   No results for input(s): LIPASE, AMYLASE in the last 168 hours. No results for input(s): AMMONIA in the last 168 hours. Coagulation Profile: Recent Labs  Lab 03/25/19 2214  INR 1.1   Cardiac Enzymes: No results for input(s): CKTOTAL, CKMB, CKMBINDEX, TROPONINI in the last 168 hours. BNP (last 3 results) No results for input(s): PROBNP in the last 8760 hours. HbA1C: No results for input(s): HGBA1C in the last 72 hours. CBG: No results for input(s): GLUCAP in the last 168 hours. Lipid Profile: No results for input(s): CHOL, HDL, LDLCALC, TRIG, CHOLHDL, LDLDIRECT in the last 72 hours. Thyroid Function Tests: No results for input(s): TSH, T4TOTAL, FREET4, T3FREE, THYROIDAB in the last 72 hours. Anemia Panel: No results for input(s): VITAMINB12, FOLATE, FERRITIN, TIBC, IRON, RETICCTPCT in the last 72 hours. Urine analysis:    Component Value Date/Time   COLORURINE YELLOW (A) 02/19/2018 0727   APPEARANCEUR HAZY (A) 02/19/2018 0727   LABSPEC 1.018 02/19/2018 0727   PHURINE 5.0 02/19/2018 0727   GLUCOSEU >=500 (A) 02/19/2018 0727   HGBUR NEGATIVE 02/19/2018 0727   BILIRUBINUR NEGATIVE 02/19/2018 0727   KETONESUR NEGATIVE 02/19/2018 0727   PROTEINUR NEGATIVE 02/19/2018 0727   NITRITE NEGATIVE 02/19/2018 0727   LEUKOCYTESUR NEGATIVE 02/19/2018 0727     Radiological Exams on Admission: DG Chest Port 1 View  Result Date: 03/25/2019 CLINICAL  DATA:  86 year old male with sepsis. EXAM: PORTABLE CHEST 1 VIEW COMPARISON:  Chest radiograph dated 02/19/2018 FINDINGS: There is shallow inspiration. Cardiomegaly with vascular congestion. Bilateral lower lung field peripheral and subpleural hazy densities may represent atelectasis although developing infiltrate not excluded. Clinical correlation is recommended. No large pleural effusion or pneumothorax. No acute osseous pathology. IMPRESSION: 1. Cardiomegaly with vascular congestion. 2. Bilateral lower lung field subpleural atelectasis versus developing infiltrate. Clinical correlation is recommended. Electronically Signed   By: Elgie Collard M.D.   On: 03/25/2019 22:56    EKG: Independently reviewed.   Assessment/Plan Principal Problem:   Sepsis (HCC)   Pneumonia COVID-19 virus, history of Acute hypoxic respiratory failure -Patient presents with tachycardia, respiratory failure and altered mental status with likely pneumonia, thus meeting sepsis criteria.  Actiq acid normal- -Patient received an IV fluid bolus in the emergency room, though not complete amount due to the possibility of Covid related hypoxic respiratory failure -Follow procalcitonin to evaluate for bacterial superinfection -Patient is Covid positive but was diagnosed 2 weeks prior and removed from isolation on 03/24/2019 -Continue Zosyn and azithromycin to treat pneumonia with possible aspiration -Dexamethasone -Continue supplemental oxygen to keep sats over 92% -Fall and aspiration precautions   Acute metabolic encephalopathy Secondary to the acute illness above, sepsis, pneumonia and respiratory failure Fall and aspiration precautions Keep n.p.o. until more alert and passes swallow eval    History of TIA (transient ischemic attack) -Head CT with no acute intracranial findings Can resume home antiplatelets and statins  when safe to take by mouth    Chronic anticoagulation -Eliquis listed among prior home med list pending verification Spoke with daughter who does not know about him being on anticoagulants SCD for DVT prophylaxis pending verification    HTN (hypertension) Blood pressure, fairly controlled To resume home meds when able to take by mouth    Dementia (HCC) without behavioral disturbance -Resume home meds when more awake, he is on Aricept    Type 2 diabetes mellitus without complication, with long-term current use of insulin (HCC) -Hold home medication regimen -Supplemental insulin coverage      DVT prophylaxis: SCD, pending verification of chronic Eliquis treatment Code Status: DNR, per patient's wishes according to conversation with the daughter Family Communication:Daughter, Read Drivers Disposition Plan: Back to previous home environment Consults called: none   Andris Baumann MD [facility]     03/26/2019, 1:41 AM

## 2019-03-26 NOTE — Progress Notes (Signed)
PHARMACY - PHYSICIAN COMMUNICATION CRITICAL VALUE ALERT - BLOOD CULTURE IDENTIFICATION (BCID)  Max [name] is an 86 y.o. male who presented to [facility] on 03/25/2019 with a chief complaint of fever.  Assessment:  Lab reports 1 of 4 bottles w/ GPC, will be sent to [facility] for further w/u  Name of physician (or Provider) Contacted: Reyes Ivan, NP  Current antibiotics: Zithromax and Zosyn  Changes to prescribed antibiotics recommended: Ancef 2gm IV q8h Recommendations accepted by provider  No results found for this or any previous visit.  Valrie Hart A 03/26/2019  10:48 PM

## 2019-03-26 NOTE — Progress Notes (Signed)
Pt had irregular heartbeat going into the 40s per tele. Md notified and aware. New orders received.

## 2019-03-26 NOTE — Progress Notes (Signed)
PROGRESS NOTE                                                                                                                                                                                                             Patient Demographics:    Max [name], is a 86 y.o. male, DOB - 1929-11-16, ZOX:[id number]  Admit date - 03/25/2019   Admitting Physician Athena Masse, MD  Outpatient Primary MD for the patient is [facility]  LOS - 0    Chief Complaint  Patient presents with  . Altered Mental Status       Brief Narrative 87 year old male with severe dementia, insulin-dependent type 2 diabetes mellitus, GERD, history of TIA, hypertension brought by EMS with altered mental status.  As per daughter patient was diagnosed with COVID-19 2 weeks prior and had symptoms of diarrhea only. In the ED patient was somnolent with low-grade fever of 100 F, elevated blood pressure 163/93 mmHg, tachycardic in the low 110s, tachypneic and O2 sat of 88% on room air.  He had normal WBC and lactic acid.  Covid PCR was positive, negative for influenza and mildly elevated troponin.  Chest x-ray showed cardiomegaly with vascular congestion with bilateral atelectasis versus developing infiltrate. Patient started on IV fluid bolus with empiric vancomycin, cefepime and Flagyl with concern for sepsis.    Subjective:   Patient still sleepy and arousable to command only.  Not in distress and maintaining sats in the low 90s on room air.   Assessment  & Plan :    Principal Problem:    Sepsis ([place]) Acute respiratory failure with hypoxia ([place]) Patient septic on presentation with hypoxic respiratory failure and acute toxic metabolic encephalopathy. Received IV fluid bolus in the ED and placed on empiric antibiotic (Zosyn and azithromycin) for possible aspiration pneumonia. Continue Decadron and supplemental oxygen.  Patient started  on IV remdesivir. CRP and fibrin derivative D-dimer elevated.  Monitor daily.       Active problems Toxic metabolic encephalopathy Likely secondary to sepsis.  Has underlying dementia.  Avoid narcotic or benzos.  Check head CT and EEG.  Check UA, follow blood culture, head CT, ammonia, B12. Serial neurochecks.  Keep n.p.o.  Maintain aspiration precautions  History of TIA Follow head CT.  Meds on hold due to encephalopathy.  ?  Chronic anticoagulation Medication  list shows patient being on Eliquis.  Daughter unaware.  Monitor off of anticoagulation for now  Essential hypertension Stable.  Off meds due to encephalopathy  Dementia without behavioral disturbance Resume Aricept once mental status improves  Type 2 diabetes mellitus with long-term use of insulin, controlled Monitor on sliding scale insulin only as patient is n.p.o.  A1c of 8.8  Code Status : DNR  Family Communication  :  will update daughter  Disposition Plan  : Home versus SNF pending clinical improvement  Barriers For Discharge : Active symptoms  Consults  : None  Procedures  : CT head, EEG  DVT Prophylaxis  :  Lovenox -   Lab Results  Component Value Date   PLT 210 03/26/2019    Antibiotics  :   Anti-infectives (From admission, onward)   Start     Dose/Rate Route Frequency Ordered Stop   03/27/19 1000  remdesivir 100 mg in sodium chloride 0.9 % 100 mL IVPB     100 mg 200 mL/hr over 30 Minutes Intravenous Daily 03/26/19 0126 03/31/19 0959   03/26/19 0800  azithromycin (ZITHROMAX) 500 mg in sodium chloride 0.9 % 250 mL IVPB     500 mg 250 mL/hr over 60 Minutes Intravenous Every 24 hours 03/26/19 0120     03/26/19 0600  piperacillin-tazobactam (ZOSYN) IVPB 3.375 g     3.375 g 12.5 mL/hr over 240 Minutes Intravenous Every 8 hours 03/26/19 0124     03/26/19 0300  remdesivir 200 mg in sodium chloride 0.9% 250 mL IVPB     200 mg 580 mL/hr over 30 Minutes Intravenous Once 03/26/19 0126 03/26/19 0721    03/26/19 0130  cefTRIAXone (ROCEPHIN) 2 g in sodium chloride 0.9 % 100 mL IVPB  Status:  Discontinued     2 g 200 mL/hr over 30 Minutes Intravenous Every 24 hours 03/26/19 0120 03/26/19 0121   03/26/19 0130  piperacillin-tazobactam (ZOSYN) IVPB 3.375 g  Status:  Discontinued     3.375 g 100 mL/hr over 30 Minutes Intravenous Every 8 hours 03/26/19 0121 03/26/19 0123   03/26/19 0030  vancomycin (VANCOCIN) IVPB 1000 mg/200 mL premix     1,000 mg 200 mL/hr over 60 Minutes Intravenous  Once 03/25/19 2226 03/26/19 0144   03/25/19 2245  ceFEPIme (MAXIPIME) 2 g in sodium chloride 0.9 % 100 mL IVPB     2 g 200 mL/hr over 30 Minutes Intravenous  Once 03/25/19 2223 03/26/19 0011   03/25/19 2230  metroNIDAZOLE (FLAGYL) IVPB 500 mg     500 mg 100 mL/hr over 60 Minutes Intravenous  Once 03/25/19 2223 03/26/19 0011   03/25/19 2230  vancomycin (VANCOCIN) IVPB 1000 mg/200 mL premix     1,000 mg 200 mL/hr over 60 Minutes Intravenous  Once 03/25/19 2223 03/26/19 0144        Objective:   Vitals:   03/26/19 0309 03/26/19 0700 03/26/19 0746 03/26/19 0800  BP: (!) 171/82  (!) 150/64 (!) 164/59  Pulse: 93 81 83 77  Resp: 18 (!) 22 19 18   Temp: 100.3 F (37.9 C)  98.5 F (36.9 C)   TempSrc: Oral  Oral   SpO2:  100% 97% 100%  Weight: 113 kg     Height:        Wt Readings from Last 3 Encounters:  03/26/19 113 kg  08/04/17 117.9 kg  05/24/17 116.1 kg     Intake/Output Summary (Last 24 hours) at 03/26/2019 1414 Last data filed at 03/26/2019 0600 Gross per 24  hour  Intake 159.15 ml  Output 0 ml  Net 159.15 ml     Physical Exam  Gen: Patient somnolent but arousable, confused, HEENT: Moist mucosa, supple neck Chest: clear b/l, no added sounds CVS: N S1&S2, no murmurs,  GI: soft, NT, ND Musculoskeletal: warm, no edema CNS: Somnolent but arousable, moving all extremities    Data Review:    CBC Recent Labs  Lab 03/25/19 2214 03/26/19 0828  WBC 8.3 6.5  HGB 14.1 13.2  HCT 44.4  41.4  PLT 220 210  MCV 91.2 92.6  MCH 29.0 29.5  MCHC 31.8 31.9  RDW 13.0 13.0  LYMPHSABS 1.0  --   MONOABS 0.7  --   EOSABS 0.0  --   BASOSABS 0.0  --     Chemistries  Recent Labs  Lab 03/25/19 2214 03/26/19 0828  NA 143  --   K 4.3  --   CL 104  --   CO2 27  --   GLUCOSE 217*  --   BUN 21  --   CREATININE 1.08  --   CALCIUM 9.0  --   MG  --  2.0  AST 26  --   ALT 20  --   ALKPHOS 89  --   BILITOT 0.9  --    ------------------------------------------------------------------------------------------------------------------ No results for input(s): CHOL, HDL, LDLCALC, TRIG, CHOLHDL, LDLDIRECT in the last 72 hours.  Lab Results  Component Value Date   HGBA1C 8.8 (H) 03/26/2019   ------------------------------------------------------------------------------------------------------------------ No results for input(s): TSH, T4TOTAL, T3FREE, THYROIDAB in the last 72 hours.  Invalid input(s): FREET3 ------------------------------------------------------------------------------------------------------------------ No results for input(s): VITAMINB12, FOLATE, FERRITIN, TIBC, IRON, RETICCTPCT in the last 72 hours.  Coagulation profile Recent Labs  Lab 03/25/19 2214 03/26/19 0536  INR 1.1 1.1    No results for input(s): DDIMER in the last 72 hours.  Cardiac Enzymes No results for input(s): CKMB, TROPONINI, MYOGLOBIN in the last 168 hours.  Invalid input(s): CK ------------------------------------------------------------------------------------------------------------------    Component Value Date/Time   BNP 50.0 03/26/2019 0030    Inpatient Medications  Scheduled Meds: . dexamethasone (DECADRON) injection  6 mg Intravenous Q24H  . insulin aspart  0-15 Units Subcutaneous Q4H   Continuous Infusions: . sodium chloride 100 mL/hr at 03/26/19 0600  . azithromycin 500 mg (03/26/19 0834)  . piperacillin-tazobactam (ZOSYN)  IV 3.375 g (03/26/19 4431)  . [START ON  03/27/2019] remdesivir 100 mg in NS 100 mL     PRN Meds:.acetaminophen **OR** acetaminophen, ondansetron **OR** ondansetron (ZOFRAN) IV, senna-docusate  Micro Results Recent Results (from the past 240 hour(s))  Blood Culture (routine x 2)     Status: None (Preliminary result)   Collection Time: 03/25/19 10:14 PM   Specimen: BLOOD  Result Value Ref Range Status   Specimen Description BLOOD LEFT ANTECUBITAL  Final   Special Requests   Final    BOTTLES DRAWN AEROBIC AND ANAEROBIC Blood Culture adequate volume   Culture   Final    NO GROWTH < 12 HOURS Performed at [facility], [address] [place], [place] [address]    Report Status PENDING  Incomplete  Respiratory Panel by RT PCR (Flu A&B, Covid) - Nasopharyngeal Swab     Status: Abnormal   Collection Time: 03/25/19 10:14 PM   Specimen: Nasopharyngeal Swab  Result Value Ref Range Status   SARS Coronavirus 2 by RT PCR POSITIVE (A) NEGATIVE Final    Comment: RESULT CALLED TO, READ BACK BY AND VERIFIED WITH: sarah mclendon at [employer]  on 03/26/19 rww (NOTE) SARS-CoV-2 target nucleic acids are DETECTED. SARS-CoV-2 RNA is generally detectable in upper respiratory specimens  during the acute phase of infection. Positive results are indicative of the presence of the identified virus, but do not rule out bacterial infection or co-infection with other pathogens not detected by the test. Clinical correlation with patient history and other diagnostic information is necessary to determine patient infection status. The expected result is Negative. Fact Sheet for Patients:  https://www.moore.com/ Fact Sheet for Healthcare Providers: https://www.young.biz/ This test is not yet approved or cleared by the [place] FDA and  has been authorized for detection and/or diagnosis of SARS-CoV-2 by FDA under an Emergency Use Authorization (EUA).  This EUA will remain in effect (meaning this test can be  used)  for the duration of  the COVID-19 declaration under Section 564(b)(1) of the Act, 21 U.S.C. section 360bbb-3(b)(1), unless the authorization is terminated or revoked sooner.    Influenza A by PCR NEGATIVE NEGATIVE Final   Influenza B by PCR NEGATIVE NEGATIVE Final    Comment: (NOTE) The Xpert Xpress SARS-CoV-2/FLU/RSV assay is intended as an aid in  the diagnosis of influenza from Nasopharyngeal swab specimens and  should not be used as a sole basis for treatment. Nasal washings and  aspirates are unacceptable for Xpert Xpress SARS-CoV-2/FLU/RSV  testing. Fact Sheet for Patients: https://www.moore.com/ Fact Sheet for Healthcare Providers: https://www.young.biz/ This test is not yet approved or cleared by the [place] FDA and  has been authorized for detection and/or diagnosis of SARS-CoV-2 by  FDA under an Emergency Use Authorization (EUA). This EUA will remain  in effect (meaning this test can be used) for the duration of the  Covid-19 declaration under Section 564(b)(1) of the Act, 21  U.S.C. section 360bbb-3(b)(1), unless the authorization is  terminated or revoked. Performed at [facility], [address] [place], [place] [address]   Blood Culture (routine x 2)     Status: None (Preliminary result)   Collection Time: 03/25/19 10:19 PM   Specimen: BLOOD  Result Value Ref Range Status   Specimen Description BLOOD LEFT FOREARM  Final   Special Requests   Final    BOTTLES DRAWN AEROBIC AND ANAEROBIC Blood Culture adequate volume   Culture   Final    NO GROWTH < 12 HOURS Performed at [facility], [address] [place], [place] [address]    Report Status PENDING  Incomplete    Radiology Reports DG Chest Port 1 View  Result Date: 03/25/2019 CLINICAL DATA:  86 year old male with sepsis. EXAM: PORTABLE CHEST 1 VIEW COMPARISON:  Chest radiograph dated 02/19/2018 FINDINGS: There is shallow inspiration.  Cardiomegaly with vascular congestion. Bilateral lower lung field peripheral and subpleural hazy densities may represent atelectasis although developing infiltrate not excluded. Clinical correlation is recommended. No large pleural effusion or pneumothorax. No acute osseous pathology. IMPRESSION: 1. Cardiomegaly with vascular congestion. 2. Bilateral lower lung field subpleural atelectasis versus developing infiltrate. Clinical correlation is recommended. Electronically Signed   By: Elgie Collard M.D.   On: 03/25/2019 22:56    Time Spent in minutes  25   Joncarlos Atkison M.D on 03/26/2019 at 2:14 PM  Between 7am to 7pm - Pager - [phone]  After 7pm go to [email or web address] - password [facility]  [facility] -  Office  [phone]

## 2019-03-26 NOTE — Progress Notes (Signed)
Pharmacy Antibiotic Note  Max [name] is a 86 y.o. male admitted on 03/25/2019 with bacteremia.  Pharmacy has been consulted for Ancef dosing.  Plan: Ancef 2gm IV q8hrs Continue Zosyn as currently ordered  Height: 5\' 11"  (180.3 cm) Weight: 249 lb 1.9 oz (113 kg) IBW/kg (Calculated) : 75.3  Temp (24hrs), Avg:99.4 F (37.4 C), Min:98.5 F (36.9 C), Max:100.3 F (37.9 C)  Recent Labs  Lab 03/25/19 2214 03/26/19 0030 03/26/19 0828  WBC 8.3  --  6.5  CREATININE 1.08  --   --   LATICACIDVEN 2.7* 1.6  --     Estimated Creatinine Clearance: 61.6 mL/min (by C-G formula based on SCr of 1.08 mg/dL).    Allergies  Allergen Reactions  . Metformin And Related     Antimicrobials this admission: Ancef 1/23 >>  Zosyn 1/22 >>   Dose adjustments this admission:   Microbiology results:  BCx: Lab reports 1 of 4 bottles w/ GPC, will be sent to [facility] for further w/u  UCx:    Sputum:    MRSA PCR:   Thank you for allowing pharmacy to be a part of this patient's care.  [facility] A 03/26/2019 10:58 PM

## 2019-03-27 DIAGNOSIS — B449 Aspergillosis, unspecified: Secondary | ICD-10-CM

## 2019-03-27 LAB — COMPREHENSIVE METABOLIC PANEL
ALT: 17 U/L (ref 0–44)
AST: 36 U/L (ref 15–41)
Albumin: 2.4 g/dL — ABNORMAL LOW (ref 3.5–5.0)
Alkaline Phosphatase: 64 U/L (ref 38–126)
Anion gap: 9 (ref 5–15)
BUN: 18 mg/dL (ref 8–23)
CO2: 27 mmol/L (ref 22–32)
Calcium: 8.5 mg/dL — ABNORMAL LOW (ref 8.9–10.3)
Chloride: 110 mmol/L (ref 98–111)
Creatinine, Ser: 0.79 mg/dL (ref 0.61–1.24)
GFR calc Af Amer: >60 mL/min (ref >60)
GFR calc non Af Amer: >60 mL/min (ref >60)
Glucose, Bld: 93 mg/dL (ref 70–99)
Potassium: 3.7 mmol/L (ref 3.5–5.1)
Sodium: 146 mmol/L — ABNORMAL HIGH (ref 135–145)
Total Bilirubin: 0.6 mg/dL (ref 0.3–1.2)
Total Protein: 6.3 g/dL — ABNORMAL LOW (ref 6.5–8.1)

## 2019-03-27 LAB — BLOOD CULTURE ID PANEL (REFLEXED)

## 2019-03-27 LAB — CBC
HCT: 39 % (ref 39.0–52.0)
Hemoglobin: 12.4 g/dL — ABNORMAL LOW (ref 13.0–17.0)
MCH: 29.2 pg (ref 26.0–34.0)
MCHC: 31.8 g/dL (ref 30.0–36.0)
MCV: 92 fL (ref 80.0–100.0)
Platelets: 230 10*3/uL (ref 150–400)
RBC: 4.24 MIL/uL (ref 4.22–5.81)
RDW: 13 % (ref 11.5–15.5)
WBC: 9.5 10*3/uL (ref 4.0–10.5)
nRBC: 0 % (ref 0.0–0.2)

## 2019-03-27 LAB — FIBRIN DERIVATIVES D-DIMER (ARMC ONLY): Fibrin derivatives D-dimer (ARMC): 883.54 ng/mL (FEU) — ABNORMAL HIGH (ref 0.00–499.00)

## 2019-03-27 LAB — GLUCOSE, CAPILLARY
Glucose-Capillary: 109 mg/dL — ABNORMAL HIGH (ref 70–99)
Glucose-Capillary: 70 mg/dL (ref 70–99)
Glucose-Capillary: 76 mg/dL (ref 70–99)
Glucose-Capillary: 72 mg/dL (ref 70–99)
Glucose-Capillary: 198 mg/dL — ABNORMAL HIGH (ref 70–99)
Glucose-Capillary: 286 mg/dL — ABNORMAL HIGH (ref 70–99)

## 2019-03-27 LAB — C-REACTIVE PROTEIN: CRP: 12.7 mg/dL — ABNORMAL HIGH (ref <1.0)

## 2019-03-27 LAB — VITAMIN B12: Vitamin B-12: 2299 pg/mL — ABNORMAL HIGH (ref 180–914)

## 2019-03-27 LAB — RPR: RPR Ser Ql: NONREACTIVE

## 2019-03-27 LAB — PROCALCITONIN: Procalcitonin: 0.43 ng/mL

## 2019-03-27 MED ORDER — TIMOLOL MALEATE 0.5 % OP SOLN
1.0000 [drp] | Freq: Two times a day (BID) | OPHTHALMIC | Status: DC
  Administered 2019-03-27 – 2019-04-04 (×16): 1 [drp] via OPHTHALMIC
  Filled 2019-03-27: qty 5

## 2019-03-27 MED ORDER — DARIFENACIN HYDROBROMIDE ER 7.5 MG PO TB24
7.5000 mg | ORAL_TABLET | Freq: Every day | ORAL | Status: DC
  Administered 2019-03-27 – 2019-04-04 (×9): 7.5 mg via ORAL
  Filled 2019-03-27 (×10): qty 1

## 2019-03-27 MED ORDER — LATANOPROST 0.005 % OP SOLN
1.0000 [drp] | Freq: Every day | OPHTHALMIC | Status: DC
  Administered 2019-03-27 – 2019-04-03 (×8): 1 [drp] via OPHTHALMIC
  Filled 2019-03-27: qty 2.5

## 2019-03-27 MED ORDER — BISACODYL 5 MG PO TBEC: 10.0000 mg | DELAYED_RELEASE_TABLET | Freq: Every day | ORAL | Status: DC | PRN

## 2019-03-27 MED ORDER — SODIUM CHLORIDE 0.9 % IV SOLN
3.0000 g | Freq: Four times a day (QID) | INTRAVENOUS | Status: AC
  Administered 2019-03-27 – 2019-03-31 (×17): 3 g via INTRAVENOUS
  Filled 2019-03-27: qty 3
  Filled 2019-03-27 (×6): qty 8
  Filled 2019-03-27: qty 3
  Filled 2019-03-27 (×3): qty 8
  Filled 2019-03-27: qty 3
  Filled 2019-03-27 (×2): qty 8
  Filled 2019-03-27: qty 3
  Filled 2019-03-27 (×6): qty 8
  Filled 2019-03-27: qty 3

## 2019-03-27 MED ORDER — INSULIN GLARGINE 100 UNIT/ML ~~LOC~~ SOLN
15.0000 [IU] | Freq: Every day | SUBCUTANEOUS | Status: DC
  Administered 2019-03-27: 15 [IU] via SUBCUTANEOUS
  Filled 2019-03-27 (×2): qty 0.15

## 2019-03-27 MED ORDER — DULOXETINE HCL 30 MG PO CPEP
60.0000 mg | ORAL_CAPSULE | Freq: Every day | ORAL | Status: DC
  Administered 2019-03-27 – 2019-04-04 (×9): 60 mg via ORAL
  Filled 2019-03-27 (×9): qty 2

## 2019-03-27 MED ORDER — VITAMIN D3 25 MCG (1000 UNIT) PO TABS
2000.0000 [IU] | ORAL_TABLET | Freq: Every day | ORAL | Status: DC
  Administered 2019-03-28 – 2019-04-04 (×8): 2000 [IU] via ORAL
  Filled 2019-03-27 (×14): qty 2

## 2019-03-27 MED ORDER — BRIMONIDINE TARTRATE-TIMOLOL 0.2-0.5 % OP SOLN
1.0000 [drp] | Freq: Two times a day (BID) | OPHTHALMIC | Status: DC
  Filled 2019-03-27: qty 5

## 2019-03-27 MED ORDER — LOSARTAN POTASSIUM 50 MG PO TABS
100.0000 mg | ORAL_TABLET | Freq: Every day | ORAL | Status: DC
  Administered 2019-03-27 – 2019-04-04 (×9): 100 mg via ORAL
  Filled 2019-03-27 (×9): qty 2

## 2019-03-27 MED ORDER — CYCLOSPORINE 0.05 % OP EMUL
1.0000 [drp] | Freq: Two times a day (BID) | OPHTHALMIC | Status: DC
  Administered 2019-03-27 – 2019-04-04 (×16): 1 [drp] via OPHTHALMIC
  Filled 2019-03-27 (×17): qty 1

## 2019-03-27 MED ORDER — SERTRALINE HCL 50 MG PO TABS
50.0000 mg | ORAL_TABLET | Freq: Every day | ORAL | Status: DC
  Administered 2019-03-27 – 2019-04-04 (×9): 50 mg via ORAL
  Filled 2019-03-27 (×10): qty 1

## 2019-03-27 MED ORDER — BRIMONIDINE TARTRATE 0.2 % OP SOLN
1.0000 [drp] | Freq: Two times a day (BID) | OPHTHALMIC | Status: DC
  Administered 2019-03-27 – 2019-04-04 (×16): 1 [drp] via OPHTHALMIC
  Filled 2019-03-27: qty 5

## 2019-03-27 MED ORDER — VITAMIN B-12 1000 MCG PO TABS
1000.0000 ug | ORAL_TABLET | Freq: Every day | ORAL | Status: DC
  Administered 2019-03-27 – 2019-04-04 (×9): 1000 ug via ORAL
  Filled 2019-03-27 (×8): qty 1

## 2019-03-27 MED ORDER — FLUTICASONE PROPIONATE 50 MCG/ACT NA SUSP
2.0000 | Freq: Every day | NASAL | Status: DC | PRN
  Filled 2019-03-27: qty 16

## 2019-03-27 MED ORDER — TRAZODONE HCL 50 MG PO TABS
75.0000 mg | ORAL_TABLET | Freq: Every day | ORAL | Status: DC
  Administered 2019-03-27 – 2019-04-03 (×8): 75 mg via ORAL
  Filled 2019-03-27 (×8): qty 2

## 2019-03-27 MED ORDER — DONEPEZIL HCL 5 MG PO TABS
10.0000 mg | ORAL_TABLET | Freq: Every day | ORAL | Status: DC
  Administered 2019-03-27 – 2019-04-03 (×8): 10 mg via ORAL
  Filled 2019-03-27 (×8): qty 2

## 2019-03-27 MED ORDER — ENOXAPARIN SODIUM 40 MG/0.4ML ~~LOC~~ SOLN
40.0000 mg | SUBCUTANEOUS | Status: DC
  Administered 2019-03-27 – 2019-04-03 (×8): 40 mg via SUBCUTANEOUS
  Filled 2019-03-27 (×8): qty 0.4

## 2019-03-27 MED ORDER — MEMANTINE HCL 5 MG PO TABS
10.0000 mg | ORAL_TABLET | Freq: Two times a day (BID) | ORAL | Status: DC
  Administered 2019-03-27 – 2019-04-04 (×17): 10 mg via ORAL
  Filled 2019-03-27 (×17): qty 2

## 2019-03-27 MED ORDER — ADULT MULTIVITAMIN W/MINERALS CH
1.0000 | ORAL_TABLET | Freq: Every day | ORAL | Status: DC
  Administered 2019-03-27 – 2019-04-04 (×9): 1 via ORAL
  Filled 2019-03-27 (×9): qty 1

## 2019-03-27 MED ORDER — MIRTAZAPINE 15 MG PO TABS: 7.5000 mg | ORAL_TABLET | Freq: Every day | ORAL | Status: DC

## 2019-03-27 MED ORDER — MIRABEGRON ER 50 MG PO TB24
50.0000 mg | ORAL_TABLET | Freq: Every day | ORAL | Status: DC
  Administered 2019-03-27 – 2019-04-04 (×9): 50 mg via ORAL
  Filled 2019-03-27 (×11): qty 1

## 2019-03-27 MED ORDER — ASCORBIC ACID 500 MG PO TABS
500.0000 mg | ORAL_TABLET | Freq: Every day | ORAL | Status: DC
  Administered 2019-03-27 – 2019-04-04 (×9): 500 mg via ORAL
  Filled 2019-03-27 (×7): qty 1

## 2019-03-27 NOTE — Progress Notes (Signed)
PROGRESS NOTE                                                                                                                                                                                                             Patient Demographics:    Max [name], is a 86 y.o. male, DOB - 05/15/1929, WGN:[id number]  Admit date - 03/25/2019   Admitting Physician Andris BaumannHazel Duncan V, MD  Outpatient Primary MD for the patient is [facility]  LOS - 1    Chief Complaint  Patient presents with  . Altered Mental Status       Brief Narrative 86 year old male with severe dementia, insulin-dependent type 2 diabetes mellitus, GERD, history of TIA, hypertension brought by EMS with altered mental status.  As per daughter patient was diagnosed with COVID-19 2 weeks prior and had symptoms of diarrhea only. In the ED patient was somnolent with low-grade fever of 100 F, elevated blood pressure 163/93 mmHg, tachycardic in the low 110s, tachypneic and O2 sat of 88% on room air.  He had normal WBC and lactic acid.  Covid PCR was positive, negative for influenza and mildly elevated troponin.  Chest x-ray showed cardiomegaly with vascular congestion with bilateral atelectasis versus developing infiltrate. Patient started on IV fluid bolus with empiric vancomycin, cefepime and Flagyl with concern for sepsis.    Subjective:   Patient alert and awake.  Very confused.  Denies any symptoms.   Assessment  & Plan :    Principal Problem:    Sepsis (HCC) Acute respiratory failure with hypoxia with COVID-19 infection (HCC) Patient septic on presentation with hypoxic respiratory failure and acute toxic metabolic encephalopathy. Sepsis resolved.  Not somnolent but still confused (suspect he is towards his baseline). Of fluids.  Maintaining sats on room air.  Placed on empiric IV Unasyn for possible aspiration and?  Bacteremia.  Continue IV  remdesivir (day 2/5) and IV Decadron (day 2/10).  Add vitamin C and zinc.     Active problems Toxic metabolic encephalopathy Likely secondary to sepsis.  Has underlying dementia.  Avoid narcotic or benzos.  Head CT unremarkable.  Pending EEG.  UA, B12 and ammonia negative.  Blood culture going coag negative staph (likely contaminant.) Started on dysphagia level 1 diet.  SLP eval.  History of TIA CT head negative.  Resume home meds   ?  Chronic anticoagulation Medication list shows patient being on Eliquis.  Daughter unaware.  Monitor off of anticoagulation for now  Essential hypertension Stable.  Resume meds  Dementia without behavioral disturbance Resume Aricept   Type 2 diabetes mellitus with long-term use of insulin, uncontrolled Monitor on sliding scale coverage.  A1c of 8.8  Code Status : DNR  Family Communication  :  will update daughter  Disposition Plan  : Home versus SNF pending clinical improvement  Barriers For Discharge : Active symptoms  Consults  : None  Procedures  : CT head, EEG  DVT Prophylaxis  :  Lovenox -   Lab Results  Component Value Date   PLT 230 03/27/2019    Antibiotics  :   Anti-infectives (From admission, onward)   Start     Dose/Rate Route Frequency Ordered Stop   03/27/19 1200  Ampicillin-Sulbactam (UNASYN) 3 g in sodium chloride 0.9 % 100 mL IVPB     3 g 200 mL/hr over 30 Minutes Intravenous Every 6 hours 03/27/19 0802     03/27/19 1000  remdesivir 100 mg in sodium chloride 0.9 % 100 mL IVPB     100 mg 200 mL/hr over 30 Minutes Intravenous Daily 03/26/19 0126 03/31/19 0959   03/26/19 2345  ceFAZolin (ANCEF) IVPB 2g/100 mL premix  Status:  Discontinued     2 g 200 mL/hr over 30 Minutes Intravenous Every 8 hours 03/26/19 2257 03/27/19 0801   03/26/19 0800  azithromycin (ZITHROMAX) 500 mg in sodium chloride 0.9 % 250 mL IVPB  Status:  Discontinued     500 mg 250 mL/hr over 60 Minutes Intravenous Every 24 hours 03/26/19 0120  03/26/19 2255   03/26/19 0600  piperacillin-tazobactam (ZOSYN) IVPB 3.375 g  Status:  Discontinued     3.375 g 12.5 mL/hr over 240 Minutes Intravenous Every 8 hours 03/26/19 0124 03/27/19 0801   03/26/19 0300  remdesivir 200 mg in sodium chloride 0.9% 250 mL IVPB     200 mg 580 mL/hr over 30 Minutes Intravenous Once 03/26/19 0126 03/26/19 0721   03/26/19 0130  cefTRIAXone (ROCEPHIN) 2 g in sodium chloride 0.9 % 100 mL IVPB  Status:  Discontinued     2 g 200 mL/hr over 30 Minutes Intravenous Every 24 hours 03/26/19 0120 03/26/19 0121   03/26/19 0130  piperacillin-tazobactam (ZOSYN) IVPB 3.375 g  Status:  Discontinued     3.375 g 100 mL/hr over 30 Minutes Intravenous Every 8 hours 03/26/19 0121 03/26/19 0123   03/26/19 0030  vancomycin (VANCOCIN) IVPB 1000 mg/200 mL premix     1,000 mg 200 mL/hr over 60 Minutes Intravenous  Once 03/25/19 2226 03/26/19 0144   03/25/19 2245  ceFEPIme (MAXIPIME) 2 g in sodium chloride 0.9 % 100 mL IVPB     2 g 200 mL/hr over 30 Minutes Intravenous  Once 03/25/19 2223 03/26/19 0011   03/25/19 2230  metroNIDAZOLE (FLAGYL) IVPB 500 mg     500 mg 100 mL/hr over 60 Minutes Intravenous  Once 03/25/19 2223 03/26/19 0011   03/25/19 2230  vancomycin (VANCOCIN) IVPB 1000 mg/200 mL premix     1,000 mg 200 mL/hr over 60 Minutes Intravenous  Once 03/25/19 2223 03/26/19 0144        Objective:   Vitals:   03/26/19 0800 03/26/19 1500 03/26/19 2326 03/27/19 0833  BP: (!) 164/59 110/67 (!) 178/85 (!) 163/81  Pulse: 77 72 (!) 52   Resp: 18 20    Temp:  98.6 F (37 C) 97.7 F (  36.5 C) 99.6 F (37.6 C)  TempSrc:  Oral Axillary Oral  SpO2: 100% 100% 93%   Weight:      Height:        Wt Readings from Last 3 Encounters:  03/26/19 113 kg  08/04/17 117.9 kg  05/24/17 116.1 kg     Intake/Output Summary (Last 24 hours) at 03/27/2019 1336 Last data filed at 03/27/2019 0600 Gross per 24 hour  Intake 1200 ml  Output 600 ml  Net 600 ml    Physical exam Alert  and awake, confused HEENT: Moist mucosa, supple neck Chest: Clear CVs: Normal S1-S2 GI: Soft, nondistended, nontender Musculoskeletal: Warm, no edema    Data Review:    CBC Recent Labs  Lab 03/25/19 2214 03/26/19 0828 03/27/19 0601  WBC 8.3 6.5 9.5  HGB 14.1 13.2 12.4*  HCT 44.4 41.4 39.0  PLT 220 210 230  MCV 91.2 92.6 92.0  MCH 29.0 29.5 29.2  MCHC 31.8 31.9 31.8  RDW 13.0 13.0 13.0  LYMPHSABS 1.0  --   --   MONOABS 0.7  --   --   EOSABS 0.0  --   --   BASOSABS 0.0  --   --     Chemistries  Recent Labs  Lab 03/25/19 2214 03/26/19 0828 03/27/19 0601  NA 143  --  146*  K 4.3  --  3.7  CL 104  --  110  CO2 27  --  27  GLUCOSE 217*  --  93  BUN 21  --  18  CREATININE 1.08  --  0.79  CALCIUM 9.0  --  8.5*  MG  --  2.0  --   AST 26  --  36  ALT 20  --  17  ALKPHOS 89  --  64  BILITOT 0.9  --  0.6   ------------------------------------------------------------------------------------------------------------------ No results for input(s): CHOL, HDL, LDLCALC, TRIG, CHOLHDL, LDLDIRECT in the last 72 hours.  Lab Results  Component Value Date   HGBA1C 8.8 (H) 03/26/2019   ------------------------------------------------------------------------------------------------------------------ No results for input(s): TSH, T4TOTAL, T3FREE, THYROIDAB in the last 72 hours.  Invalid input(s): FREET3 ------------------------------------------------------------------------------------------------------------------ Recent Labs    03/26/19 1458  VITAMINB12 2,299*    Coagulation profile Recent Labs  Lab 03/25/19 2214 03/26/19 0536  INR 1.1 1.1    No results for input(s): DDIMER in the last 72 hours.  Cardiac Enzymes No results for input(s): CKMB, TROPONINI, MYOGLOBIN in the last 168 hours.  Invalid input(s): CK ------------------------------------------------------------------------------------------------------------------    Component Value Date/Time   BNP  50.0 03/26/2019 0030    Inpatient Medications  Scheduled Meds: . dexamethasone (DECADRON) injection  6 mg Intravenous Q24H  . enoxaparin (LOVENOX) injection  40 mg Subcutaneous Q24H  . insulin aspart  0-15 Units Subcutaneous Q4H   Continuous Infusions: . sodium chloride 100 mL/hr at 03/26/19 1548  . ampicillin-sulbactam (UNASYN) IV    . remdesivir 100 mg in NS 100 mL 100 mg (03/27/19 1044)   PRN Meds:.acetaminophen **OR** acetaminophen, ondansetron **OR** ondansetron (ZOFRAN) IV, senna-docusate  Micro Results Recent Results (from the past 240 hour(s))  Blood Culture (routine x 2)     Status: None (Preliminary result)   Collection Time: 03/25/19 10:14 PM   Specimen: BLOOD  Result Value Ref Range Status   Specimen Description   Final    BLOOD LEFT ANTECUBITAL Performed at [facility], [address], [place], [place] [address]    Special Requests   Final    BOTTLES DRAWN  AEROBIC AND ANAEROBIC Blood Culture adequate volume Performed at [facility], [address] [place], [place] [address]    Culture  Setup Time   Final    GRAM POSITIVE COCCI IN BOTH AEROBIC AND ANAEROBIC BOTTLES CRITICAL RESULT CALLED TO, READ BACK BY AND VERIFIED WITH: Dexter AT 8921 03/27/19 SDR Performed at [facility], [place] [address], [place], [place] [address]    Culture GRAM POSITIVE COCCI  Final   Report Status PENDING  Incomplete  Respiratory Panel by RT PCR (Flu A&B, Covid) - Nasopharyngeal Swab     Status: Abnormal   Collection Time: 03/25/19 10:14 PM   Specimen: Nasopharyngeal Swab  Result Value Ref Range Status   SARS Coronavirus 2 by RT PCR POSITIVE (A) NEGATIVE Final    Comment: RESULT CALLED TO, READ BACK BY AND VERIFIED WITH: sarah mclendon at [place] on 03/26/19 rww (NOTE) SARS-CoV-2 target nucleic acids are DETECTED. SARS-CoV-2 RNA is generally detectable in upper respiratory specimens  during the acute phase of infection. Positive results are  indicative of the presence of the identified virus, but do not rule out bacterial infection or co-infection with other pathogens not detected by the test. Clinical correlation with patient history and other diagnostic information is necessary to determine patient infection status. The expected result is Negative. Fact Sheet for Patients:  PinkCheek.be Fact Sheet for Healthcare Providers: GravelBags.it This test is not yet approved or cleared by the [place] FDA and  has been authorized for detection and/or diagnosis of SARS-CoV-2 by FDA under an Emergency Use Authorization (EUA).  This EUA will remain in effect (meaning this test can be used)  for the duration of  the COVID-19 declaration under Section 564(b)(1) of the Act, 21 U.S.C. section 360bbb-3(b)(1), unless the authorization is terminated or revoked sooner.    Influenza A by PCR NEGATIVE NEGATIVE Final   Influenza B by PCR NEGATIVE NEGATIVE Final    Comment: (NOTE) The Xpert Xpress SARS-CoV-2/FLU/RSV assay is intended as an aid in  the diagnosis of influenza from Nasopharyngeal swab specimens and  should not be used as a sole basis for treatment. Nasal washings and  aspirates are unacceptable for Xpert Xpress SARS-CoV-2/FLU/RSV  testing. Fact Sheet for Patients: PinkCheek.be Fact Sheet for Healthcare Providers: GravelBags.it This test is not yet approved or cleared by the [place] FDA and  has been authorized for detection and/or diagnosis of SARS-CoV-2 by  FDA under an Emergency Use Authorization (EUA). This EUA will remain  in effect (meaning this test can be used) for the duration of the  Covid-19 declaration under Section 564(b)(1) of the Act, 21  U.S.C. section 360bbb-3(b)(1), unless the authorization is  terminated or revoked. Performed at [facility], [address]  [place],  [address]   Blood Culture ID Panel (Reflexed)     Status: Abnormal   Collection Time: 03/25/19 10:14 PM  Result Value Ref Range Status   Enterococcus species NOT DETECTED NOT DETECTED Final   Listeria monocytogenes NOT DETECTED NOT DETECTED Final   Staphylococcus species DETECTED (A) NOT DETECTED Final    Comment: Methicillin (oxacillin) susceptible coagulase negative staphylococcus. Possible blood culture contaminant (unless isolated from more than one blood culture draw or clinical case suggests pathogenicity). No antibiotic treatment is indicated for blood  culture contaminants. CRITICAL RESULT CALLED TO, READ BACK BY AND VERIFIED WITH: SCOTT HALL AT 4818 03/27/19 SDR    Staphylococcus aureus (BCID) NOT DETECTED NOT DETECTED Final   Methicillin resistance NOT  DETECTED NOT DETECTED Final   Streptococcus species NOT DETECTED NOT DETECTED Final   Streptococcus agalactiae NOT DETECTED NOT DETECTED Final   Streptococcus pneumoniae NOT DETECTED NOT DETECTED Final   Streptococcus pyogenes NOT DETECTED NOT DETECTED Final   Acinetobacter baumannii NOT DETECTED NOT DETECTED Final   Enterobacteriaceae species NOT DETECTED NOT DETECTED Final   Enterobacter cloacae complex NOT DETECTED NOT DETECTED Final   Escherichia coli NOT DETECTED NOT DETECTED Final   Klebsiella oxytoca NOT DETECTED NOT DETECTED Final   Klebsiella pneumoniae NOT DETECTED NOT DETECTED Final   Proteus species NOT DETECTED NOT DETECTED Final   Serratia marcescens NOT DETECTED NOT DETECTED Final   Haemophilus influenzae NOT DETECTED NOT DETECTED Final   Neisseria meningitidis NOT DETECTED NOT DETECTED Final   Pseudomonas aeruginosa NOT DETECTED NOT DETECTED Final   Candida albicans NOT DETECTED NOT DETECTED Final   Candida glabrata NOT DETECTED NOT DETECTED Final   Candida krusei NOT DETECTED NOT DETECTED Final   Candida parapsilosis NOT DETECTED NOT DETECTED Final   Candida tropicalis NOT DETECTED NOT DETECTED  Final    Comment: Performed at [facility], [address] [place], [place] [address]  Blood Culture (routine x 2)     Status: None (Preliminary result)   Collection Time: 03/25/19 10:19 PM   Specimen: BLOOD  Result Value Ref Range Status   Specimen Description BLOOD LEFT FOREARM  Final   Special Requests   Final    BOTTLES DRAWN AEROBIC AND ANAEROBIC Blood Culture adequate volume   Culture  Setup Time   Final    AEROBIC BOTTLE ONLY GRAM POSITIVE COCCI CRITICAL RESULT CALLED TO, READ BACK BY AND VERIFIED WITH: SCOTT HALL ON 03/27/19 AT 0300 [facility] Performed at [facility], [address], [place], [place] [address]    Culture GRAM POSITIVE COCCI  Final   Report Status PENDING  Incomplete    Radiology Reports CT HEAD WO CONTRAST  Result Date: 03/26/2019 CLINICAL DATA:  Mental status changes and fever. EXAM: CT HEAD WITHOUT CONTRAST TECHNIQUE: Contiguous axial images were obtained from the base of the skull through the vertex without intravenous contrast. COMPARISON:  02/19/2018 FINDINGS: Brain: There is no evidence for acute hemorrhage, hydrocephalus, mass lesion, or abnormal extra-axial fluid collection. No definite CT evidence for acute infarction. Diffuse loss of parenchymal volume is consistent with atrophy. Patchy low attenuation in the deep hemispheric and periventricular white matter is nonspecific, but likely reflects chronic microvascular ischemic demyelination. Stable prominence of the lateral ventricles, presumably from central atrophy. Vascular: No hyperdense vessel or unexpected calcification. Skull: Normal. Negative for fracture or focal lesion. No evidence for fracture. No worrisome lytic or sclerotic lesion. Sinuses/Orbits: The visualized paranasal sinuses and mastoid air cells are clear. Visualized portions of the globes and intraorbital fat are unremarkable. Other: None. IMPRESSION: 1. No acute intracranial abnormality. 2. Atrophy with chronic small vessel  white matter ischemic disease. Electronically Signed   By: Kennith Center M.D.   On: 03/26/2019 16:54   DG Chest Port 1 View  Result Date: 03/25/2019 CLINICAL DATA:  86 year old male with sepsis. EXAM: PORTABLE CHEST 1 VIEW COMPARISON:  Chest radiograph dated 02/19/2018 FINDINGS: There is shallow inspiration. Cardiomegaly with vascular congestion. Bilateral lower lung field peripheral and subpleural hazy densities may represent atelectasis although developing infiltrate not excluded. Clinical correlation is recommended. No large pleural effusion or pneumothorax. No acute osseous pathology. IMPRESSION: 1. Cardiomegaly with vascular congestion. 2. Bilateral lower lung field subpleural atelectasis versus developing infiltrate. Clinical correlation  is recommended. Electronically Signed   By: Elgie Collard M.D.   On: 03/25/2019 22:56    Time Spent in minutes 35   Brinkley Peet M.D on 03/27/2019 at 1:36 PM  Between 7am to 7pm - Pager - [phone]  After 7pm go to [email or web address] - password [facility]  [facility] -  Office  [phone]

## 2019-03-27 NOTE — Progress Notes (Signed)
Updated daughter last night of plan of care. Verbalized understanding. Would like physician to call to discuss plan and results of testing done.

## 2019-03-28 DIAGNOSIS — E119 Type 2 diabetes mellitus without complications: Secondary | ICD-10-CM

## 2019-03-28 DIAGNOSIS — Z794 Long term (current) use of insulin: Secondary | ICD-10-CM

## 2019-03-28 DIAGNOSIS — J690 Pneumonitis due to inhalation of food and vomit: Secondary | ICD-10-CM

## 2019-03-28 LAB — COMPREHENSIVE METABOLIC PANEL
ALT: 19 U/L (ref 0–44)
AST: 34 U/L (ref 15–41)
Albumin: 2.7 g/dL — ABNORMAL LOW (ref 3.5–5.0)
Alkaline Phosphatase: 79 U/L (ref 38–126)
Anion gap: 12 (ref 5–15)
BUN: 21 mg/dL (ref 8–23)
CO2: 21 mmol/L — ABNORMAL LOW (ref 22–32)
Calcium: 8.5 mg/dL — ABNORMAL LOW (ref 8.9–10.3)
Chloride: 104 mmol/L (ref 98–111)
Creatinine, Ser: 0.77 mg/dL (ref 0.61–1.24)
GFR calc Af Amer: >60 mL/min (ref >60)
GFR calc non Af Amer: >60 mL/min (ref >60)
Glucose, Bld: 388 mg/dL — ABNORMAL HIGH (ref 70–99)
Potassium: 4.5 mmol/L (ref 3.5–5.1)
Sodium: 137 mmol/L (ref 135–145)
Total Bilirubin: 0.6 mg/dL (ref 0.3–1.2)
Total Protein: 6.7 g/dL (ref 6.5–8.1)

## 2019-03-28 LAB — GLUCOSE, CAPILLARY
Glucose-Capillary: 128 mg/dL — ABNORMAL HIGH (ref 70–99)
Glucose-Capillary: 128 mg/dL — ABNORMAL HIGH (ref 70–99)
Glucose-Capillary: 148 mg/dL — ABNORMAL HIGH (ref 70–99)
Glucose-Capillary: 213 mg/dL — ABNORMAL HIGH (ref 70–99)
Glucose-Capillary: 300 mg/dL — ABNORMAL HIGH (ref 70–99)
Glucose-Capillary: 402 mg/dL — ABNORMAL HIGH (ref 70–99)
Glucose-Capillary: 413 mg/dL — ABNORMAL HIGH (ref 70–99)
Glucose-Capillary: 63 mg/dL — ABNORMAL LOW (ref 70–99)
Glucose-Capillary: 76 mg/dL (ref 70–99)

## 2019-03-28 LAB — C-REACTIVE PROTEIN: CRP: 8.8 mg/dL — ABNORMAL HIGH (ref <1.0)

## 2019-03-28 LAB — CBC
HCT: 42.5 % (ref 39.0–52.0)
Hemoglobin: 13.7 g/dL (ref 13.0–17.0)
MCH: 29 pg (ref 26.0–34.0)
MCHC: 32.2 g/dL (ref 30.0–36.0)
MCV: 90 fL (ref 80.0–100.0)
Platelets: 286 10*3/uL (ref 150–400)
RBC: 4.72 MIL/uL (ref 4.22–5.81)
RDW: 12.9 % (ref 11.5–15.5)
WBC: 8.9 10*3/uL (ref 4.0–10.5)
nRBC: 0 % (ref 0.0–0.2)

## 2019-03-28 LAB — URINE CULTURE: Culture: NO GROWTH

## 2019-03-28 LAB — FIBRIN DERIVATIVES D-DIMER (ARMC ONLY): Fibrin derivatives D-dimer (ARMC): 798.96 ng/mL (FEU) — ABNORMAL HIGH (ref 0.00–499.00)

## 2019-03-28 LAB — PROCALCITONIN: Procalcitonin: 0.15 ng/mL

## 2019-03-28 MED ORDER — ENSURE ENLIVE PO LIQD
237.0000 mL | Freq: Two times a day (BID) | ORAL | Status: DC
  Administered 2019-03-28 – 2019-04-04 (×14): 237 mL via ORAL

## 2019-03-28 MED ORDER — INSULIN GLARGINE 100 UNIT/ML ~~LOC~~ SOLN
20.0000 [IU] | Freq: Every day | SUBCUTANEOUS | Status: DC
  Administered 2019-03-28: 20 [IU] via SUBCUTANEOUS
  Filled 2019-03-28 (×2): qty 0.2

## 2019-03-28 MED ORDER — INSULIN ASPART 100 UNIT/ML ~~LOC~~ SOLN
0.0000 [IU] | Freq: Three times a day (TID) | SUBCUTANEOUS | Status: DC
  Administered 2019-03-28: 12:00:00 3 [IU] via SUBCUTANEOUS
  Filled 2019-03-28: qty 1

## 2019-03-28 MED ORDER — INSULIN ASPART 100 UNIT/ML ~~LOC~~ SOLN
0.0000 [IU] | Freq: Three times a day (TID) | SUBCUTANEOUS | Status: DC
  Administered 2019-03-29: 5 [IU] via SUBCUTANEOUS
  Administered 2019-03-29: 2 [IU] via SUBCUTANEOUS
  Administered 2019-03-29 – 2019-03-30 (×2): 5 [IU] via SUBCUTANEOUS
  Administered 2019-03-30: 18:00:00 8 [IU] via SUBCUTANEOUS
  Administered 2019-03-31: 13:00:00 3 [IU] via SUBCUTANEOUS
  Administered 2019-03-31 – 2019-04-01 (×2): 11 [IU] via SUBCUTANEOUS
  Administered 2019-04-01 (×2): 5 [IU] via SUBCUTANEOUS
  Administered 2019-04-02: 15 [IU] via SUBCUTANEOUS
  Administered 2019-04-02: 5 [IU] via SUBCUTANEOUS
  Administered 2019-04-02: 2 [IU] via SUBCUTANEOUS
  Administered 2019-04-03: 5 [IU] via SUBCUTANEOUS
  Administered 2019-04-03: 18:00:00 8 [IU] via SUBCUTANEOUS
  Administered 2019-04-04: 08:00:00 11 [IU] via SUBCUTANEOUS
  Administered 2019-04-04: 8 [IU] via SUBCUTANEOUS
  Filled 2019-03-28 (×16): qty 1

## 2019-03-28 MED ORDER — INSULIN ASPART 100 UNIT/ML ~~LOC~~ SOLN
8.0000 [IU] | Freq: Once | SUBCUTANEOUS | Status: AC
  Administered 2019-03-28: 19:00:00 8 [IU] via SUBCUTANEOUS
  Filled 2019-03-28: qty 1

## 2019-03-28 NOTE — Progress Notes (Signed)
PROGRESS NOTE                                                                                                                                                                                                             Patient Demographics:    Max [name], is a 86 y.o. male, DOB - [birth year], NUU:[id number]  Admit date - 03/25/2019   Admitting Physician Andris Baumann, MD  Outpatient Primary MD for the patient is [facility]  LOS - 2    Chief Complaint  Patient presents with  . Altered Mental Status       Brief Narrative 86 year old male with severe dementia, insulin-dependent type 2 diabetes mellitus, GERD, history of TIA, hypertension brought by EMS with altered mental status.  As per daughter patient was diagnosed with COVID-19 2 weeks prior and had symptoms of diarrhea only. In the ED patient was somnolent with low-grade fever of 100 F, elevated blood pressure 163/93 mmHg, tachycardic in the low 110s, tachypneic and O2 sat of 88% on room air.  He had normal WBC and lactic acid.  Covid PCR was positive, negative for influenza and mildly elevated troponin.  Chest x-ray showed cardiomegaly with vascular congestion with bilateral atelectasis versus developing infiltrate. Patient started on IV fluid bolus with empiric vancomycin, cefepime and Flagyl with concern for sepsis.    Subjective:   Alert and awake.  Confused at baseline.  No overnight events.   Assessment  & Plan :    Principal Problem:    Sepsis (HCC) Acute respiratory failure with hypoxia with COVID-19 infection (HCC) Patient septic on presentation with hypoxic respiratory failure and acute toxic metabolic encephalopathy. Sepsis and encephalopathy resolved.  Patient confused at baseline.  Encephalopathy has resolved Sats stable on room air.  Continue empiric IV Unasyn for possible aspiration.  Blood culture growing staph epidermidis which  is likely a contaminant.  Continue IV remdesivir (day 3/5) and IV Decadron (day 3/10).  Continue vitamin C and zinc.  Patient should complete inpatient 5-day course of IV remdesivir.   Active problems Toxic metabolic encephalopathy Likely secondary to sepsis.  Currently resolved and I suspect he is at baseline.  Has underlying dementia.  Avoid narcotic or benzos.  Head CT unremarkable.  Follow EEG.  UA, B12 and ammonia normal. Started on dysphagia level 1 diet.  SLP eval.  Aspiration pneumonia On empiric Unasyn.  Blood culture likely a contaminant.  Swallow eval.  History of TIA CT head negative.  Continue home meds   ?  Chronic anticoagulation Medication list shows patient being on Eliquis.  Daughter unaware.  Monitor off of anticoagulation for now  Essential hypertension Stable.  Resume meds  Dementia without behavioral disturbance Continue Aricept.  Type 2 diabetes mellitus with long-term use of insulin, uncontrolled A1c of 8.8.  Noted for some drop in blood glucose overnight.  Switch to sensitive sliding scale coverage.  Code Status : DNR  Family Communication  :  will update daughter  Disposition Plan  : Home versus SNF pending clinical improvement.  PT eval.  Barriers For Discharge : Active symptoms  Consults  : None  Procedures  : CT head, EEG  DVT Prophylaxis  :  Lovenox -   Lab Results  Component Value Date   PLT 230 03/27/2019    Antibiotics  :   Anti-infectives (From admission, onward)   Start     Dose/Rate Route Frequency Ordered Stop   03/27/19 1200  Ampicillin-Sulbactam (UNASYN) 3 g in sodium chloride 0.9 % 100 mL IVPB     3 g 200 mL/hr over 30 Minutes Intravenous Every 6 hours 03/27/19 0802     03/27/19 1000  remdesivir 100 mg in sodium chloride 0.9 % 100 mL IVPB     100 mg 200 mL/hr over 30 Minutes Intravenous Daily 03/26/19 0126 03/31/19 0959   03/26/19 2345  ceFAZolin (ANCEF) IVPB 2g/100 mL premix  Status:  Discontinued     2 g 200 mL/hr  over 30 Minutes Intravenous Every 8 hours 03/26/19 2257 03/27/19 0801   03/26/19 0800  azithromycin (ZITHROMAX) 500 mg in sodium chloride 0.9 % 250 mL IVPB  Status:  Discontinued     500 mg 250 mL/hr over 60 Minutes Intravenous Every 24 hours 03/26/19 0120 03/26/19 2255   03/26/19 0600  piperacillin-tazobactam (ZOSYN) IVPB 3.375 g  Status:  Discontinued     3.375 g 12.5 mL/hr over 240 Minutes Intravenous Every 8 hours 03/26/19 0124 03/27/19 0801   03/26/19 0300  remdesivir 200 mg in sodium chloride 0.9% 250 mL IVPB     200 mg 580 mL/hr over 30 Minutes Intravenous Once 03/26/19 0126 03/26/19 0721   03/26/19 0130  cefTRIAXone (ROCEPHIN) 2 g in sodium chloride 0.9 % 100 mL IVPB  Status:  Discontinued     2 g 200 mL/hr over 30 Minutes Intravenous Every 24 hours 03/26/19 0120 03/26/19 0121   03/26/19 0130  piperacillin-tazobactam (ZOSYN) IVPB 3.375 g  Status:  Discontinued     3.375 g 100 mL/hr over 30 Minutes Intravenous Every 8 hours 03/26/19 0121 03/26/19 0123   03/26/19 0030  vancomycin (VANCOCIN) IVPB 1000 mg/200 mL premix     1,000 mg 200 mL/hr over 60 Minutes Intravenous  Once 03/25/19 2226 03/26/19 0144   03/25/19 2245  ceFEPIme (MAXIPIME) 2 g in sodium chloride 0.9 % 100 mL IVPB     2 g 200 mL/hr over 30 Minutes Intravenous  Once 03/25/19 2223 03/26/19 0011   03/25/19 2230  metroNIDAZOLE (FLAGYL) IVPB 500 mg     500 mg 100 mL/hr over 60 Minutes Intravenous  Once 03/25/19 2223 03/26/19 0011   03/25/19 2230  vancomycin (VANCOCIN) IVPB 1000 mg/200 mL premix     1,000 mg 200 mL/hr over 60 Minutes Intravenous  Once 03/25/19 2223 03/26/19 0144        Objective:   Vitals:  03/28/19 0403 03/28/19 0530 03/28/19 0720 03/28/19 1024  BP: (!) 167/77  (!) 147/72   Pulse: 86 69 (!) 50 95  Resp: (!) 22 18 18  (!) 21  Temp: 98 F (36.7 C)  98.1 F (36.7 C)   TempSrc: Oral  Oral   SpO2: 93% 91% 95% (!) 89%  Weight:      Height:        Wt Readings from Last 3 Encounters:  03/26/19  113 kg  08/04/17 117.9 kg  05/24/17 116.1 kg     Intake/Output Summary (Last 24 hours) at 03/28/2019 1314 Last data filed at 03/28/2019 1044 Gross per 24 hour  Intake 548.36 ml  Output 1000 ml  Net -451.64 ml   Physical exam Elderly male not in distress, disoriented at baseline HEENT: Moist mucosa Chest: Clear bilaterally CVs: Normal S1-S2 GI: Soft, nondistended, nontender Musculoskeletal: Warm, no edema    Data Review:    CBC Recent Labs  Lab 03/25/19 2214 03/26/19 0828 03/27/19 0601  WBC 8.3 6.5 9.5  HGB 14.1 13.2 12.4*  HCT 44.4 41.4 39.0  PLT 220 210 230  MCV 91.2 92.6 92.0  MCH 29.0 29.5 29.2  MCHC 31.8 31.9 31.8  RDW 13.0 13.0 13.0  LYMPHSABS 1.0  --   --   MONOABS 0.7  --   --   EOSABS 0.0  --   --   BASOSABS 0.0  --   --     Chemistries  Recent Labs  Lab 03/25/19 2214 03/26/19 0828 03/27/19 0601  NA 143  --  146*  K 4.3  --  3.7  CL 104  --  110  CO2 27  --  27  GLUCOSE 217*  --  93  BUN 21  --  18  CREATININE 1.08  --  0.79  CALCIUM 9.0  --  8.5*  MG  --  2.0  --   AST 26  --  36  ALT 20  --  17  ALKPHOS 89  --  64  BILITOT 0.9  --  0.6   ------------------------------------------------------------------------------------------------------------------ No results for input(s): CHOL, HDL, LDLCALC, TRIG, CHOLHDL, LDLDIRECT in the last 72 hours.  Lab Results  Component Value Date   HGBA1C 8.8 (H) 03/26/2019   ------------------------------------------------------------------------------------------------------------------ No results for input(s): TSH, T4TOTAL, T3FREE, THYROIDAB in the last 72 hours.  Invalid input(s): FREET3 ------------------------------------------------------------------------------------------------------------------ Recent Labs    03/26/19 1458  VITAMINB12 2,299*    Coagulation profile Recent Labs  Lab 03/25/19 2214 03/26/19 0536  INR 1.1 1.1    No results for input(s): DDIMER in the last 72  hours.  Cardiac Enzymes No results for input(s): CKMB, TROPONINI, MYOGLOBIN in the last 168 hours.  Invalid input(s): CK ------------------------------------------------------------------------------------------------------------------    Component Value Date/Time   BNP 50.0 03/26/2019 0030    Inpatient Medications  Scheduled Meds: . ascorbic acid  500 mg Oral Daily  . brimonidine  1 drop Right Eye BID   And  . timolol  1 drop Right Eye BID  . cholecalciferol  2,000 Units Oral Daily  . cycloSPORINE  1 drop Right Eye BID  . darifenacin  7.5 mg Oral Daily  . dexamethasone (DECADRON) injection  6 mg Intravenous Q24H  . donepezil  10 mg Oral QHS  . DULoxetine  60 mg Oral Daily  . enoxaparin (LOVENOX) injection  40 mg Subcutaneous Q24H  . feeding supplement (ENSURE ENLIVE)  237 mL Oral BID BM  . insulin aspart  0-9 Units Subcutaneous TID  WC  . insulin glargine  15 Units Subcutaneous QHS  . latanoprost  1 drop Right Eye QHS  . losartan  100 mg Oral Daily  . memantine  10 mg Oral BID  . mirabegron ER  50 mg Oral Daily  . multivitamin with minerals  1 tablet Oral Daily  . sertraline  50 mg Oral Daily  . traZODone  75 mg Oral QHS  . vitamin B-12  1,000 mcg Oral Daily   Continuous Infusions: . ampicillin-sulbactam (UNASYN) IV 3 g (03/28/19 0554)  . remdesivir 100 mg in NS 100 mL 100 mg (03/28/19 1019)   PRN Meds:.acetaminophen **OR** acetaminophen, bisacodyl, fluticasone, ondansetron **OR** ondansetron (ZOFRAN) IV, senna-docusate  Micro Results Recent Results (from the past 240 hour(s))  Blood Culture (routine x 2)     Status: Abnormal (Preliminary result)   Collection Time: 03/25/19 10:14 PM   Specimen: BLOOD  Result Value Ref Range Status   Specimen Description   Final    BLOOD LEFT ANTECUBITAL Performed at [facility], [address] [place], [place] [address]    Special Requests   Final    BOTTLES DRAWN AEROBIC AND ANAEROBIC Blood Culture adequate  volume Performed at [facility], [address] [place], [place] [address]    Culture  Setup Time   Final    GRAM POSITIVE COCCI IN BOTH AEROBIC AND ANAEROBIC BOTTLES CRITICAL RESULT CALLED TO, READ BACK BY AND VERIFIED WITH: SCOTT HALL AT [id number] 03/27/19 SDR    Culture (A)  Final    STAPHYLOCOCCUS EPIDERMIDIS SUSCEPTIBILITIES TO FOLLOW Performed at [facility], [address] [place], [place] [address]    Report Status PENDING  Incomplete  Respiratory Panel by RT PCR (Flu A&B, Covid) - Nasopharyngeal Swab     Status: Abnormal   Collection Time: 03/25/19 10:14 PM   Specimen: Nasopharyngeal Swab  Result Value Ref Range Status   SARS Coronavirus 2 by RT PCR POSITIVE (A) NEGATIVE Final    Comment: RESULT CALLED TO, READ BACK BY AND VERIFIED WITH: sarah mclendon at 0010 on 03/26/19 rww (NOTE) SARS-CoV-2 target nucleic acids are DETECTED. SARS-CoV-2 RNA is generally detectable in upper respiratory specimens  during the acute phase of infection. Positive results are indicative of the presence of the identified virus, but do not rule out bacterial infection or co-infection with other pathogens not detected by the test. Clinical correlation with patient history and other diagnostic information is necessary to determine patient infection status. The expected result is Negative. Fact Sheet for Patients:  https://www.moore.com/https://www.fda.gov/media/142436/download Fact Sheet for Healthcare Providers: https://www.young.biz/https://www.fda.gov/media/142435/download This test is not yet approved or cleared by the [place] FDA and  has been authorized for detection and/or diagnosis of SARS-CoV-2 by FDA under an Emergency Use Authorization (EUA).  This EUA will remain in effect (meaning this test can be used)  for the duration of  the COVID-19 declaration under Section 564(b)(1) of the Act, 21 U.S.C. section 360bbb-3(b)(1), unless the authorization is terminated or revoked sooner.    Influenza A by PCR  NEGATIVE NEGATIVE Final   Influenza B by PCR NEGATIVE NEGATIVE Final    Comment: (NOTE) The Xpert Xpress SARS-CoV-2/FLU/RSV assay is intended as an aid in  the diagnosis of influenza from Nasopharyngeal swab specimens and  should not be used as a sole basis for treatment. Nasal washings and  aspirates are unacceptable for Xpert Xpress SARS-CoV-2/FLU/RSV  testing. Fact Sheet for Patients: https://www.moore.com/https://www.fda.gov/media/142436/download Fact Sheet for Healthcare Providers: https://www.young.biz/https://www.fda.gov/media/142435/download This test is not  yet approved or cleared by the [place] and  has been authorized for detection and/or diagnosis of SARS-CoV-2 by  FDA under an Emergency Use Authorization (EUA). This EUA will remain  in effect (meaning this test can be used) for the duration of the  Covid-19 declaration under Section 564(b)(1) of the Act, 21  U.S.C. section 360bbb-3(b)(1), unless the authorization is  terminated or revoked. Performed at [facility], [address] [place], [place] [address]   Blood Culture ID Panel (Reflexed)     Status: Abnormal   Collection Time: 03/25/19 10:14 PM  Result Value Ref Range Status   Enterococcus species NOT DETECTED NOT DETECTED Final   Listeria monocytogenes NOT DETECTED NOT DETECTED Final   Staphylococcus species DETECTED (A) NOT DETECTED Final    Comment: Methicillin (oxacillin) susceptible coagulase negative staphylococcus. Possible blood culture contaminant (unless isolated from more than one blood culture draw or clinical case suggests pathogenicity). No antibiotic treatment is indicated for blood  culture contaminants. CRITICAL RESULT CALLED TO, READ BACK BY AND VERIFIED WITH: SCOTT HALL AT 0509 03/27/19 SDR    Staphylococcus aureus (BCID) NOT DETECTED NOT DETECTED Final   Methicillin resistance NOT DETECTED NOT DETECTED Final   Streptococcus species NOT DETECTED NOT DETECTED Final   Streptococcus agalactiae NOT DETECTED NOT DETECTED  Final   Streptococcus pneumoniae NOT DETECTED NOT DETECTED Final   Streptococcus pyogenes NOT DETECTED NOT DETECTED Final   Acinetobacter baumannii NOT DETECTED NOT DETECTED Final   Enterobacteriaceae species NOT DETECTED NOT DETECTED Final   Enterobacter cloacae complex NOT DETECTED NOT DETECTED Final   Escherichia coli NOT DETECTED NOT DETECTED Final   Klebsiella oxytoca NOT DETECTED NOT DETECTED Final   Klebsiella pneumoniae NOT DETECTED NOT DETECTED Final   Proteus species NOT DETECTED NOT DETECTED Final   Serratia marcescens NOT DETECTED NOT DETECTED Final   Haemophilus influenzae NOT DETECTED NOT DETECTED Final   Neisseria meningitidis NOT DETECTED NOT DETECTED Final   Pseudomonas aeruginosa NOT DETECTED NOT DETECTED Final   Candida albicans NOT DETECTED NOT DETECTED Final   Candida glabrata NOT DETECTED NOT DETECTED Final   Candida krusei NOT DETECTED NOT DETECTED Final   Candida parapsilosis NOT DETECTED NOT DETECTED Final   Candida tropicalis NOT DETECTED NOT DETECTED Final    Comment: Performed at [facility], [address] [place], [place] [address]  Blood Culture (routine x 2)     Status: Abnormal (Preliminary result)   Collection Time: 03/25/19 10:19 PM   Specimen: BLOOD  Result Value Ref Range Status   Specimen Description   Final    BLOOD LEFT FOREARM Performed at [facility], [address], [place], [place] [address]    Special Requests   Final    BOTTLES DRAWN AEROBIC AND ANAEROBIC Blood Culture adequate volume Performed at [facility], [address] [place], [place] [address]    Culture  Setup Time   Final    AEROBIC BOTTLE ONLY GRAM POSITIVE COCCI CRITICAL RESULT CALLED TO, READ BACK BY AND VERIFIED WITH: SCOTT HALL ON 03/27/19 AT 0300 [facility] Performed at [facility], [address] [place], [place] [address]    Culture STAPHYLOCOCCUS EPIDERMIDIS (A)  Final   Report Status PENDING  Incomplete  Urine Culture      Status: None   Collection Time: 03/26/19  6:12 PM   Specimen: Urine, Clean Catch  Result Value Ref Range Status   Specimen Description   Final    URINE, CLEAN  CATCH Performed at [facility], [address] [place], [place] [address]    Special Requests   Final    NONE Performed at [facility], [address] [place], [place] [address]    Culture   Final    NO GROWTH Performed at [facility], [address] [place], [place] [address]    Report Status 03/28/2019 FINAL  Final    Radiology Reports CT HEAD WO CONTRAST  Result Date: 03/26/2019 CLINICAL DATA:  Mental status changes and fever. EXAM: CT HEAD WITHOUT CONTRAST TECHNIQUE: Contiguous axial images were obtained from the base of the skull through the vertex without intravenous contrast. COMPARISON:  02/19/2018 FINDINGS: Brain: There is no evidence for acute hemorrhage, hydrocephalus, mass lesion, or abnormal extra-axial fluid collection. No definite CT evidence for acute infarction. Diffuse loss of parenchymal volume is consistent with atrophy. Patchy low attenuation in the deep hemispheric and periventricular white matter is nonspecific, but likely reflects chronic microvascular ischemic demyelination. Stable prominence of the lateral ventricles, presumably from central atrophy. Vascular: No hyperdense vessel or unexpected calcification. Skull: Normal. Negative for fracture or focal lesion. No evidence for fracture. No worrisome lytic or sclerotic lesion. Sinuses/Orbits: The visualized paranasal sinuses and mastoid air cells are clear. Visualized portions of the globes and intraorbital fat are unremarkable. Other: None. IMPRESSION: 1. No acute intracranial abnormality. 2. Atrophy with chronic small vessel white matter ischemic disease. Electronically Signed   By: Kennith CenterEric  Mansell M.D.   On: 03/26/2019 16:54   DG Chest Port 1 View  Result Date: 03/25/2019 CLINICAL DATA:  86 year old male with sepsis. EXAM:  PORTABLE CHEST 1 VIEW COMPARISON:  Chest radiograph dated 02/19/2018 FINDINGS: There is shallow inspiration. Cardiomegaly with vascular congestion. Bilateral lower lung field peripheral and subpleural hazy densities may represent atelectasis although developing infiltrate not excluded. Clinical correlation is recommended. No large pleural effusion or pneumothorax. No acute osseous pathology. IMPRESSION: 1. Cardiomegaly with vascular congestion. 2. Bilateral lower lung field subpleural atelectasis versus developing infiltrate. Clinical correlation is recommended. Electronically Signed   By: Elgie CollardArash  Radparvar M.D.   On: 03/25/2019 22:56    Time Spent in minutes 25   Elliott Lasecki M.D on 03/28/2019 at 1:14 PM  Between 7am to 7pm - Pager - [phone]  After 7pm go to [email or web address] - password [email or web address]  [facility] -  Office  [phone]

## 2019-03-28 NOTE — Evaluation (Signed)
Clinical/Bedside Swallow Evaluation Patient Details  Name: Max [name] MRN: [id number] Date of Birth: Oct 22, 1929  Today's Date: 03/28/2019 Time: SLP Start Time (ACUTE ONLY): 1220 SLP Stop Time (ACUTE ONLY): 1310 SLP Time Calculation (min) (ACUTE ONLY): 50 min  Past Medical History:  Past Medical History:  Diagnosis Date  . Arthritis   . Diabetes mellitus without complication (HCC)   . Glaucoma   . Hypertension    Past Surgical History:  Past Surgical History:  Procedure Laterality Date  . APPENDECTOMY     HPI:  Pt is a 86 year old male with severe dementia, insulin-dependent type 2 diabetes mellitus, GERD, history of TIA, hypertension brought by EMS with altered mental status.  As per daughter patient was diagnosed with COVID-19 2 weeks prior and had symptoms of diarrhea only. On EMS arrival, pt tachypneic, hypoxic, minimally responsive. Has become more responsive on O2. CXR: "Bilateral lower lung field subpleural atelectasis versus developing infiltrate; Cardiomegaly with vascular congestion".  Pt resides at an Assisted Living facility, Memory Care unit.  Noted BMI, weight baseline.    Assessment / Plan / Recommendation Clinical Impression  Pt appears to present w/ grossly adequate oropharyngeal phase swallow function w/ po trials during assessment today. Pt does have a baseline of advanced Dementia and appeared to look off/become distracted during tasks intermittently. He required assistance w/ taking finger food bites and holding Cup to drink but needed full encouragement to eat/take bites. Baseline Dementia can impact attention to oral/po tasks and increase risk for dysphagia, aspiration. During evaluation today, pt required Full assistance for sitting upright for po intake -- unaware of his positioning. Pt then fed self drinks by holding the Cup w/ and w/out the straw, purees, and soft solids fed to him by SLP. No overt clinical s/s of aspiration were noted for all trials except w/ 1  trial of thin liquids via straw -- an immediate cough followed sips of liquids. This did not occur again -- unsure if pt was distracted or lost focus to task as he did not seem to take more that 2 sips at a time. No other overt s/s of aspiration noted; no decline in vocal quality or respiratory effort during/post trials of liquids or solids. Oral phase c/b grossly adequate oral management of boluses -- timely mastication and A-P transfer noted; oral clearing achieved post all trials/consistencies given. OM exam grossly WFL; native dentition w/ only few missing. Pt required encouragment and monitoring during po trials for attention and follow through w/ feeding task.  Recommend a Dysphagia level 3 w/ chopped/minced meats, gravy; Thin liquids. Recommend general aspiration precautions including monitoring of Straw drinking and use of Cup drinking only IF increased coughing noted when using a straw. Recommend Pills in Puree for safer swallowing -- Crushed if needed. Recommend Monitoring at meals to assist pt in attention to meal task; follow through w/ self-feeding and aspiration precautions. ST services can be available for further if needs arise while admitted.  SLP Visit Diagnosis: Dysphagia, unspecified (R13.10)    Aspiration Risk  Mild aspiration risk;Risk for inadequate nutrition/hydration(reduced when following general aspiration precs)    Diet Recommendation  Dysphagia level 3 w/ Chopped/Minced meats w/ gravies added; Thin liquids. General aspiration precautions. Supervision during meals to assist as needed and monitor safe follow through w/ eating/drinking tasks.  Medication Administration: Crushed with puree(for safer swallowing d/t Dementia)    Other  Recommendations Recommended Consults: (Dietician f/u) Oral Care Recommendations: Oral care BID;Staff/trained caregiver to provide oral care Other Recommendations: (n/a)  Follow up Recommendations None      Frequency and Duration (n/a)  (n/a)        Prognosis Prognosis for Safe Diet Advancement: Fair(-Good) Barriers to Reach Goals: Cognitive deficits;Time post onset;Severity of deficits(baseline)      Swallow Study   General Date of Onset: 03/26/19 HPI: Pt is a 86 year old male with severe dementia, insulin-dependent type 2 diabetes mellitus, GERD, history of TIA, hypertension brought by EMS with altered mental status.  As per daughter patient was diagnosed with COVID-19 2 weeks prior and had symptoms of diarrhea only. On EMS arrival, pt tachypneic, hypoxic, minimally responsive. Has become more responsive on O2. CXR: "Bilateral lower lung field subpleural atelectasis versus developing infiltrate; Cardiomegaly with vascular congestion".  Pt resides at an Assisted Living facility, Memory Care unit.  Noted BMI, weight baseline.  Type of Study: Bedside Swallow Evaluation Previous Swallow Assessment: none reported Diet Prior to this Study: Dysphagia 1 (puree);Thin liquids(at admission once awake) Temperature Spikes Noted: No(wbc 9.5) Respiratory Status: Nasal cannula(but weaning as it was off pt, on wall) History of Recent Intubation: No Behavior/Cognition: Alert;Cooperative;Pleasant mood;Confused;Distractible;Requires cueing(baseline Dementia) Oral Cavity Assessment: Within Functional Limits Oral Care Completed by SLP: Recent completion by staff Oral Cavity - Dentition: Adequate natural dentition Vision: Functional for self-feeding Self-Feeding Abilities: Able to feed self;Needs assist;Needs set up;Total assist(needed cues/encouragement) Patient Positioning: Upright in bed(needed full positioning) Baseline Vocal Quality: Normal;Low vocal intensity(mumbled responses at times-Dementia impact) Volitional Cough: Cognitively unable to elicit Volitional Swallow: Unable to elicit    Oral/Motor/Sensory Function Overall Oral Motor/Sensory Function: Within functional limits(w/ cues/model)   Ice Chips Ice chips: Within functional  limits Presentation: Spoon(fed; 2 trials)   Thin Liquid Thin Liquid: Within functional limits Presentation: Self Fed;Cup;Straw(5 trials via cup; 8+ trials via straw) Oral Phase Impairments: (none) Oral Phase Functional Implications: (none) Pharyngeal  Phase Impairments: Cough - Immediate(x1 w/ straw use) Other Comments: not noted any other time; NSG has not noted coughing w/ straw use    Nectar Thick Nectar Thick Liquid: Not tested   Honey Thick Honey Thick Liquid: Not tested   Puree Puree: Within functional limits Presentation: Spoon(fed; 8+ trials)   Solid     Solid: Within functional limits(grossly) Presentation: Spoon(fed; 8+ trials) Other Comments: pt assisted w/ taking finger food bites but needed full encouragement to eat/take bites; pt held cup to drink        Orinda Kenner, MS, CCC-SLP Max [name] 03/28/2019,3:48 PM

## 2019-03-28 NOTE — Progress Notes (Signed)
Spoke with daughter, [employer], and provided update about father. Answered questions and informed Melissa if status changed I will update her. Family member stated no further questions at this time.

## 2019-03-28 NOTE — Progress Notes (Signed)
Contacted Dhungel, MD about patient blood glucose at 413. Dhungel stated to give coverage aspart plus ordered aspart. MD discontinued coverage order and placed one time order for aspart 8 units. Unable to administer the discontinued order. Messaged and paged MD with no new orders at this time. 8 units ordered were given per charting. Informed oncoming RN of this occurrence.

## 2019-03-28 NOTE — TOC Initial Note (Signed)
Transition of Care [facility]) - Initial/Assessment Note    Patient Details  Name: Max [name] MRN: [id number] Date of Birth: 1929/12/17  [facility]) CM/SW Contact:    Elease Hashimoto, LCSW Phone Number: 03/28/2019, 3:42 PM  Clinical Narrative:  Pt is not able to participate due to severe dementia. Obtained information from daughter-Melissa via telephone. He has been living at [facility] for a year and is in the memory care unit. He was using a rw and/or wc depending upon how he was doing that day. She plans for him to return when medically stable. She gave permission for worker to talk with [facility]. Spoke with Juliann Pulse at the facility who asked how he was and wonders if he needs to go to SNF for rehab before coming back to their facility. Informed has not been seen by PT yet and with his severe dementia he may not be eligible for rehab but to return to [place] and get PT services there. Speech has evaluated and not changed his diet. Will work on discharge needs and coordinate discharge needs.          Expected Discharge Plan: Assisted Living Barriers to Discharge: Continued Medical Work up   Patient Goals and CMS Choice Patient states their goals for this hospitalization and ongoing recovery are:: Pt unable to communicate due to severe dementia. Daughter wants him to get better and return back to [facility]      Expected Discharge Plan and Services Expected Discharge Plan: Assisted Living In-house Referral: Clinical Social Work Discharge Planning Services: NA   Living arrangements for the past 2 months: [place]                                      Prior Living Arrangements/Services Living arrangements for the past 2 months: [place] Lives with:: Facility Resident(memory care at [facility])                   Activities of Daily Living Home Assistive Devices/Equipment: None ADL Screening (condition at time of  admission) Patient's cognitive ability adequate to safely complete daily activities?: No Is the patient deaf or have difficulty hearing?: No Does the patient have difficulty seeing, even when wearing glasses/contacts?: No Does the patient have difficulty concentrating, remembering, or making decisions?: Yes Patient able to express need for assistance with ADLs?: No Does the patient have difficulty dressing or bathing?: Yes Independently performs ADLs?: No Communication: Needs assistance, Dependent Is this a change from baseline?: Pre-admission baseline Dressing (OT): Needs assistance, Dependent Is this a change from baseline?: Pre-admission baseline Grooming: Needs assistance, Dependent Is this a change from baseline?: Pre-admission baseline Feeding: Needs assistance, Dependent Is this a change from baseline?: Pre-admission baseline Bathing: Dependent, Needs assistance Is this a change from baseline?: Pre-admission baseline Toileting: Dependent, Needs assistance Is this a change from baseline?: Pre-admission baseline In/Out Bed: Dependent, Needs assistance Is this a change from baseline?: Pre-admission baseline Walks in Home: Needs assistance, Dependent Is this a change from baseline?: Pre-admission baseline Does the patient have difficulty walking or climbing stairs?: Yes Weakness of Legs: Both Weakness of Arms/Hands: Both  Permission Sought/Granted Permission sought to share information with : Facility Contact Representative(Kathy at [facility]) Permission granted to share information with : Yes, Verbal Permission Granted  Share Information with NAME: Juliann Pulse  Permission granted to share info w AGENCY: [facility]  Emotional Assessment Appearance:: Appears stated age            Admission diagnosis:  Acute respiratory failure with hypoxia (HCC) [J96.01] Sepsis (HCC) [A41.9] Sepsis with acute hypoxic respiratory failure without septic shock, due to unspecified organism  (HCC) [A41.9, R65.20, J96.01] Patient Active Problem List   Diagnosis Date Noted  . Sepsis (HCC) 03/26/2019  . Pneumonia due to COVID-19 virus 03/26/2019  . History of TIA (transient ischemic attack) 03/26/2019  . Chronic anticoagulation 03/26/2019  . HTN (hypertension) 03/26/2019  . Dementia (HCC) 03/26/2019  . Type 2 diabetes mellitus without complication, with long-term current use of insulin (HCC) 03/26/2019  . Acute metabolic encephalopathy 03/26/2019  . Acute respiratory failure with hypoxia (HCC) 03/26/2019   PCP:  Almetta Lovely, [facility]:   [facility] - [place], [place] - [address] [place] [address] Phone: [phone] Fax: [phone]     Social Determinants of Health (SDOH) Interventions    Readmission Risk Interventions No flowsheet data found.

## 2019-03-28 NOTE — Progress Notes (Signed)
Inpatient Diabetes Program Recommendations  AACE/ADA: New Consensus Statement on Inpatient Glycemic Control (2015)  Target Ranges:  Prepandial:   less than 140 mg/dL      Peak postprandial:   less than 180 mg/dL (1-2 hours)      Critically ill patients:  140 - 180 mg/dL   Lab Results  Component Value Date   GLUCAP 128 (H) 03/28/2019   HGBA1C 8.8 (H) 03/26/2019    Review of Glycemic Control Results for Max [name], Max [name] (MRN [id number]) as of 03/28/2019 09:26  Ref. Range 03/27/2019 19:37 03/28/2019 00:22 03/28/2019 03:38 03/28/2019 04:02 03/28/2019 07:23  Glucose-Capillary Latest Ref Range: 70 - 99 mg/dL 741 (H) 638 (H) 63 (L) 76 128 (H)   Diabetes history: DM2 Outpatient Diabetes medications: Lantus 15 units bid + Novolog 2-14 units tid meal coverage + Glucotrol XL 20 mg qd + Actos 15 mg Current orders for Inpatient glycemic control: Lantus 15 units + Novolog moderate q 4 hrs.  Inpatient Diabetes Program Recommendations:    Noted hypoglycemia post Novolog correction. -Decrease Novolog correction to sensitive tid + hs 0-5 units  Thank you, Darel Hong E. Ryer Asato, RN, MSN, CDE  Diabetes Coordinator Inpatient Glycemic Control Team Team Pager [phone] (8am-5pm) 03/28/2019 9:29 AM

## 2019-03-29 ENCOUNTER — Inpatient Hospital Stay (HOSPITAL_COMMUNITY)
Admit: 2019-03-29 | Discharge: 2019-03-29 | Disposition: A | Discharge status: Home / Self Care | Payer: Medicare Other | Attending: Internal Medicine | Admitting: Internal Medicine

## 2019-03-29 DIAGNOSIS — B957 Other staphylococcus as the cause of diseases classified elsewhere: Secondary | ICD-10-CM

## 2019-03-29 DIAGNOSIS — Z888 Allergy status to other drugs, medicaments and biological substances status: Secondary | ICD-10-CM

## 2019-03-29 DIAGNOSIS — R7881 Bacteremia: Secondary | ICD-10-CM

## 2019-03-29 DIAGNOSIS — F0390 Unspecified dementia without behavioral disturbance: Secondary | ICD-10-CM

## 2019-03-29 DIAGNOSIS — L089 Local infection of the skin and subcutaneous tissue, unspecified: Secondary | ICD-10-CM

## 2019-03-29 DIAGNOSIS — I10 Essential (primary) hypertension: Secondary | ICD-10-CM

## 2019-03-29 DIAGNOSIS — R238 Other skin changes: Secondary | ICD-10-CM

## 2019-03-29 DIAGNOSIS — U071 COVID-19: Secondary | ICD-10-CM

## 2019-03-29 LAB — COMPREHENSIVE METABOLIC PANEL
ALT: 16 U/L (ref 0–44)
AST: 27 U/L (ref 15–41)
Albumin: 2.4 g/dL — ABNORMAL LOW (ref 3.5–5.0)
Alkaline Phosphatase: 63 U/L (ref 38–126)
Anion gap: 7 (ref 5–15)
BUN: 16 mg/dL (ref 8–23)
CO2: 27 mmol/L (ref 22–32)
Calcium: 8.5 mg/dL — ABNORMAL LOW (ref 8.9–10.3)
Chloride: 105 mmol/L (ref 98–111)
Creatinine, Ser: 0.71 mg/dL (ref 0.61–1.24)
GFR calc Af Amer: >60 mL/min (ref >60)
GFR calc non Af Amer: >60 mL/min (ref >60)
Glucose, Bld: 127 mg/dL — ABNORMAL HIGH (ref 70–99)
Potassium: 3.6 mmol/L (ref 3.5–5.1)
Sodium: 139 mmol/L (ref 135–145)
Total Bilirubin: 0.7 mg/dL (ref 0.3–1.2)
Total Protein: 6.3 g/dL — ABNORMAL LOW (ref 6.5–8.1)

## 2019-03-29 LAB — CBC
HCT: 40 % (ref 39.0–52.0)
Hemoglobin: 13 g/dL (ref 13.0–17.0)
MCH: 29.1 pg (ref 26.0–34.0)
MCHC: 32.5 g/dL (ref 30.0–36.0)
MCV: 89.7 fL (ref 80.0–100.0)
Platelets: 288 10*3/uL (ref 150–400)
RBC: 4.46 MIL/uL (ref 4.22–5.81)
RDW: 12.8 % (ref 11.5–15.5)
WBC: 10.1 10*3/uL (ref 4.0–10.5)
nRBC: 0.2 % (ref 0.0–0.2)

## 2019-03-29 LAB — GLUCOSE, CAPILLARY
Glucose-Capillary: 121 mg/dL — ABNORMAL HIGH (ref 70–99)
Glucose-Capillary: 244 mg/dL — ABNORMAL HIGH (ref 70–99)
Glucose-Capillary: 235 mg/dL — ABNORMAL HIGH (ref 70–99)
Glucose-Capillary: 243 mg/dL — ABNORMAL HIGH (ref 70–99)

## 2019-03-29 LAB — FIBRIN DERIVATIVES D-DIMER (ARMC ONLY): Fibrin derivatives D-dimer (ARMC): 813.41 ng/mL (FEU) — ABNORMAL HIGH (ref 0.00–499.00)

## 2019-03-29 LAB — ECHOCARDIOGRAM COMPLETE
Height: 71 in
Weight: 3985.92 oz

## 2019-03-29 LAB — C-REACTIVE PROTEIN: CRP: 6.6 mg/dL — ABNORMAL HIGH (ref <1.0)

## 2019-03-29 MED ORDER — VANCOMYCIN HCL 1750 MG/350ML IV SOLN
1750.0000 mg | INTRAVENOUS | Status: DC
  Administered 2019-03-30 – 2019-04-03 (×5): 1750 mg via INTRAVENOUS
  Filled 2019-03-29 (×6): qty 350

## 2019-03-29 MED ORDER — PANTOPRAZOLE SODIUM 40 MG PO TBEC
40.0000 mg | DELAYED_RELEASE_TABLET | Freq: Every day | ORAL | Status: DC
  Administered 2019-03-29 – 2019-04-04 (×7): 40 mg via ORAL
  Filled 2019-03-29 (×6): qty 1

## 2019-03-29 MED ORDER — INSULIN DETEMIR 100 UNIT/ML ~~LOC~~ SOLN
20.0000 [IU] | Freq: Every day | SUBCUTANEOUS | Status: DC
  Administered 2019-03-29 – 2019-04-01 (×4): 20 [IU] via SUBCUTANEOUS
  Filled 2019-03-29 (×4): qty 1

## 2019-03-29 MED ORDER — INSULIN ASPART 100 UNIT/ML ~~LOC~~ SOLN
5.0000 [IU] | Freq: Three times a day (TID) | SUBCUTANEOUS | Status: DC
  Administered 2019-03-29 – 2019-04-04 (×19): 5 [IU] via SUBCUTANEOUS
  Filled 2019-03-29 (×16): qty 1

## 2019-03-29 MED ORDER — VANCOMYCIN HCL 2000 MG/400ML IV SOLN
2000.0000 mg | Freq: Once | INTRAVENOUS | Status: AC
  Administered 2019-03-29: 2000 mg via INTRAVENOUS
  Filled 2019-03-29: qty 400

## 2019-03-29 NOTE — Evaluation (Signed)
Physical Therapy Evaluation Patient Details Name: Max [name] MRN: [id number] DOB: Jan 28, 1930 Today's Date: 03/29/2019   History of Present Illness  Pt admitted for sepsis and now covid +. History includes dementia, DM, HTN, and GERD. Per chart and discussion with staff, pt is confused at baseline.  Clinical Impression  Pt is a pleasant 86 year old male who was admitted for sepsis and now is +covid. Pt performs bed mobility with max assist, however unable to fully complete movement. Has mittens on upon arrival. There-ex attempted, however pt not fully able to participate. Appears to be grossly at baseline, confusion at baseline. Per chart review, appears pt is mobile in some degree, however isn't able to participate in skilled therapy at this time. Pt does not require any further PT needs at this time. Pt will be dc in house and does not require follow up. RN aware. Will dc current orders.     Follow Up Recommendations (return back to memory care unit)    Equipment Recommendations  None recommended by PT    Recommendations for Other Services       Precautions / Restrictions Precautions Precautions: Fall Restrictions Weight Bearing Restrictions: No      Mobility  Bed Mobility Overal bed mobility: Needs Assistance Bed Mobility: Supine to Sit     Supine to sit: Max assist     General bed mobility comments: assist provided to move B LE off bed. However once movement initiated, pt very resistant to movement and unable to fully complete transfer  Transfers                 General transfer comment: unable to perform  Ambulation/Gait                Stairs            Wheelchair Mobility    Modified Rankin (Stroke Patients Only)       Balance Overall balance assessment: (unable to observe)                                           Pertinent Vitals/Pain Pain Assessment: No/denies pain    Home Living Family/patient expects to  be discharged to:: (memory care unit)                      Prior Function Level of Independence: Needs assistance         Comments: per chart review, pt is ambulatory some days with RW and uses WC for other days     Hand Dominance        Extremity/Trunk Assessment   Upper Extremity Assessment Upper Extremity Assessment: Difficult to assess due to impaired cognition    Lower Extremity Assessment Lower Extremity Assessment: Difficult to assess due to impaired cognition       Communication   Communication: No difficulties  Cognition Arousal/Alertness: (sleepy) Behavior During Therapy: WFL for tasks assessed/performed Overall Cognitive Status: History of cognitive impairments - at baseline                                        General Comments      Exercises Other Exercises Other Exercises: ther-ex attempted, however unable to attend to ther-ex or follow commands for strengthening   Assessment/Plan  PT Assessment Patent does not need any further PT services  PT Problem List         PT Treatment Interventions      PT Goals (Current goals can be found in the Care Plan section)  Acute Rehab PT Goals Patient Stated Goal: unable to state PT Goal Formulation: All assessment and education complete, DC therapy Time For Goal Achievement: 03/29/19 Potential to Achieve Goals: Poor    Frequency     Barriers to discharge        Co-evaluation               AM-PAC PT "6 Clicks" Mobility  Outcome Measure Help needed turning from your back to your side while in a flat bed without using bedrails?: Total Help needed moving from lying on your back to sitting on the side of a flat bed without using bedrails?: Total Help needed moving to and from a bed to a chair (including a wheelchair)?: Total Help needed standing up from a chair using your arms (e.g., wheelchair or bedside chair)?: Total Help needed to walk in hospital room?:  Total Help needed climbing 3-5 steps with a railing? : Total 6 Click Score: 6    End of Session   Activity Tolerance: (limited by cognition) Patient left: in bed;with bed alarm set Nurse Communication: Mobility status PT Visit Diagnosis: Difficulty in walking, not elsewhere classified (R26.2)    Time: 7939-0300 PT Time Calculation (min) (ACUTE ONLY): 23 min   Charges:   PT Evaluation $PT Eval Low Complexity: 1 Low PT Treatments $Therapeutic Activity: 8-22 mins        Elizabeth Palau, PT, DPT [phone]   Lyndee Herbst 03/29/2019, 5:01 PM

## 2019-03-29 NOTE — Care Management Important Message (Signed)
Important Message  Patient Details  Name: Max [name] MRN: [id number] Date of Birth: [birth year]   Medicare Important Message Given:  Yes  Reviewed verbally with daughter.  Copy of Medicare IM sent securely to email address provided: [email or web address].     Johnell Comings 03/29/2019, 4:08 PM

## 2019-03-29 NOTE — Progress Notes (Signed)
*  PRELIMINARY RESULTS* Echocardiogram 2D Echocardiogram has been performed.  Cristela Blue 03/29/2019, 12:01 PM

## 2019-03-29 NOTE — Consult Note (Signed)
Pharmacy Antibiotic Note  Max [name] is a 86 y.o. male admitted on 03/25/2019 with bacteremia.  Pharmacy has been consulted for vancomycin dosing. Bcx growing staph epidermidis.   Plan: Will load the patient with vancomycin 2000 mg x 1. Maintenance dose of 1750 mg q24H for a predicted AUC of 500. Goal AUC of 400-550. Scr used 0.8, Css min 10.3. plan to get vancomycin level in 4-5 days.   Height: 5\' 11"  (180.3 cm) Weight: 249 lb 1.9 oz (113 kg) IBW/kg (Calculated) : 75.3  Temp (24hrs), Avg:98 F (36.7 C), Min:97.8 F (36.6 C), Max:98.1 F (36.7 C)  Recent Labs  Lab 03/25/19 2214 03/26/19 0030 03/26/19 0828 03/27/19 0601 03/28/19 1554 03/29/19 0732  WBC 8.3  --  6.5 9.5 8.9 10.1  CREATININE 1.08  --   --  0.79 0.77 0.71  LATICACIDVEN 2.7* 1.6  --   --   --   --     Estimated Creatinine Clearance: 83.2 mL/min (by C-G formula based on SCr of 0.71 mg/dL).    Allergies  Allergen Reactions  . Metformin And Related     Antimicrobials this admission: 1/23 pip/tazo >> 1/24 1/24 Unasyn >>  1/26 Vancomcyin >>   Dose adjustments this admission: None  Microbiology results: 1/22 BCx: STAPHYLOCOCCUS EPIDERMIDIS   Thank you for allowing pharmacy to be a part of this patient's care.  2/22, PharmD, BCPS 03/29/2019 3:58 PM

## 2019-03-29 NOTE — Consult Note (Signed)
NAME: Max [name]  DOB: May 11, 1929  MRN: [id number]  Date/Time: 03/29/2019 8:42 PM  REQUESTING PROVIDER: Dr. Gonzella Lex Subjective:  REASON FOR CONSULT: Staph epi bacteremia ?No history  from patient Max [name] is a 86 y.o. male  with a history of HTn, memory loss dementia, brought in from NH on 03/25/19 because of fever of 103 and slumped forward . Pt tested positive for COVID 2 weeks ago but was asymptomatic  and was in isolation and taken off on 03/24/19.  In the ED he was lethargic, temp of 100, BP 163/93, HR 113, tachypnea of 32 with oxygen stas of 88% on RA. WBC was 8k, covid was positive and CXR showed cardiomegaly and b/l lower lobe infiltrate vs atelectasis. cultures sent and started on broad spectrum antibiotic as he was thought to be septic on admission. Antibiotic was de-escalated to unasyn for possible aspiration   Past Medical History:  Diagnosis Date  . Arthritis   . Diabetes mellitus without complication (HCC)   . Glaucoma   . Hypertension     Past Surgical History:  Procedure Laterality Date  . APPENDECTOMY      Social History   Socioeconomic History  . Marital status: Widowed    Spouse name: Not on file  . Number of children: Not on file  . Years of education: Not on file  . Highest education level: Not on file  Occupational History  . Not on file  Tobacco Use  . Smoking status: Never Smoker  . Smokeless tobacco: Never Used  Substance and Sexual Activity  . Alcohol use: Yes    Comment: "very very little"  . Drug use: Never  . Sexual activity: Not on file  Other Topics Concern  . Not on file  Social History Narrative  . Not on file   Social Determinants of Health   Financial Resource Strain:   . Difficulty of Paying Living Expenses: Not on file  Food Insecurity:   . Worried About Programme researcher, broadcasting/film/video in the Last Year: Not on file  . Ran Out of Food in the Last Year: Not on file  Transportation Needs:   . Lack of Transportation (Medical): Not  on file  . Lack of Transportation (Non-Medical): Not on file  Physical Activity:   . Days of Exercise per Week: Not on file  . Minutes of Exercise per Session: Not on file  Stress:   . Feeling of Stress : Not on file  Social Connections:   . Frequency of Communication with Friends and Family: Not on file  . Frequency of Social Gatherings with Friends and Family: Not on file  . Attends Religious Services: Not on file  . Active Member of Clubs or Organizations: Not on file  . Attends Banker Meetings: Not on file  . Marital Status: Not on file  Intimate Partner Violence:   . Fear of Current or Ex-Partner: Not on file  . Emotionally Abused: Not on file  . Physically Abused: Not on file  . Sexually Abused: Not on file    Family History- NA  Allergies  Allergen Reactions  . Metformin And Related     ? Current Facility-Administered Medications  Medication Dose Route Frequency Provider Last Rate Last Admin  . acetaminophen (TYLENOL) tablet 650 mg  650 mg Oral Q6H PRN Andris Baumann, MD       Or  . acetaminophen (TYLENOL) suppository 650 mg  650 mg Rectal Q6H PRN Andris Baumann, MD      .  Ampicillin-Sulbactam (UNASYN) 3 g in sodium chloride 0.9 % 100 mL IVPB  3 g Intravenous Q6H Dhungel, Nishant, MD 200 mL/hr at 03/29/19 2013 3 g at 03/29/19 2013  . ascorbic acid (VITAMIN C) tablet 500 mg  500 mg Oral Daily Dhungel, Nishant, MD   500 mg at 03/29/19 1034  . bisacodyl (DULCOLAX) EC tablet 10 mg  10 mg Oral Daily PRN Dhungel, Nishant, MD      . brimonidine (ALPHAGAN) 0.2 % ophthalmic solution 1 drop  1 drop Right Eye BID Dhungel, Nishant, MD   1 drop at 03/29/19 2014   And  . timolol (TIMOPTIC) 0.5 % ophthalmic solution 1 drop  1 drop Right Eye BID Dhungel, Nishant, MD   1 drop at 03/29/19 2014  . cholecalciferol (VITAMIN D) tablet 2,000 Units  2,000 Units Oral Daily Dhungel, Nishant, MD   2,000 Units at 03/29/19 1034  . cycloSPORINE (RESTASIS) 0.05 % ophthalmic emulsion 1  drop  1 drop Right Eye BID Dhungel, Nishant, MD   1 drop at 03/29/19 1036  . darifenacin (ENABLEX) 24 hr tablet 7.5 mg  7.5 mg Oral Daily Dhungel, Nishant, MD   7.5 mg at 03/29/19 1037  . dexamethasone (DECADRON) injection 6 mg  6 mg Intravenous Q24H Dhungel, Nishant, MD   6 mg at 03/29/19 1035  . donepezil (ARICEPT) tablet 10 mg  10 mg Oral QHS Dhungel, Nishant, MD   10 mg at 03/28/19 2157  . DULoxetine (CYMBALTA) DR capsule 60 mg  60 mg Oral Daily Dhungel, Nishant, MD   60 mg at 03/29/19 1034  . enoxaparin (LOVENOX) injection 40 mg  40 mg Subcutaneous Q24H Dhungel, Nishant, MD   40 mg at 03/28/19 2157  . feeding supplement (ENSURE ENLIVE) (ENSURE ENLIVE) liquid 237 mL  237 mL Oral BID BM Dhungel, Nishant, MD   237 mL at 03/29/19 1300  . fluticasone (FLONASE) 50 MCG/ACT nasal spray 2 spray  2 spray Each Nare Daily PRN Dhungel, Nishant, MD      . insulin aspart (novoLOG) injection 0-15 Units  0-15 Units Subcutaneous TID WC Dhungel, Nishant, MD   5 Units at 03/29/19 1805  . insulin aspart (novoLOG) injection 5 Units  5 Units Subcutaneous TID WC Dhungel, Nishant, MD   5 Units at 03/29/19 1804  . insulin detemir (LEVEMIR) injection 20 Units  20 Units Subcutaneous QHS Dhungel, Nishant, MD      . latanoprost (XALATAN) 0.005 % ophthalmic solution 1 drop  1 drop Right Eye QHS Dhungel, Nishant, MD   1 drop at 03/28/19 2158  . losartan (COZAAR) tablet 100 mg  100 mg Oral Daily Dhungel, Nishant, MD   100 mg at 03/29/19 1034  . memantine (NAMENDA) tablet 10 mg  10 mg Oral BID Dhungel, Nishant, MD   10 mg at 03/29/19 1035  . mirabegron ER (MYRBETRIQ) tablet 50 mg  50 mg Oral Daily Dhungel, Nishant, MD   50 mg at 03/29/19 1034  . multivitamin with minerals tablet 1 tablet  1 tablet Oral Daily Dhungel, Nishant, MD   1 tablet at 03/29/19 1034  . ondansetron (ZOFRAN) tablet 4 mg  4 mg Oral Q6H PRN Andris Baumann, MD       Or  . ondansetron [facility]) injection 4 mg  4 mg Intravenous Q6H PRN Andris Baumann, MD        . pantoprazole (PROTONIX) EC tablet 40 mg  40 mg Oral Daily Dhungel, Nishant, MD   40 mg at 03/29/19 1130  .  remdesivir 100 mg in sodium chloride 0.9 % 100 mL IVPB  100 mg Intravenous Daily Valrie HartHall, Scott A, RPH   Stopped at 03/29/19 1104  . senna-docusate (Senokot-S) tablet 1 tablet  1 tablet Oral QHS PRN Andris Baumannuncan, Hazel V, MD      . sertraline (ZOLOFT) tablet 50 mg  50 mg Oral Daily Dhungel, Nishant, MD   50 mg at 03/29/19 1034  . traZODone (DESYREL) tablet 75 mg  75 mg Oral QHS Dhungel, Nishant, MD   75 mg at 03/28/19 2157  . [START ON 03/30/2019] vancomycin (VANCOREADY) IVPB 1750 mg/350 mL  1,750 mg Intravenous Q24H Ronnald Rampatel, Kishan S, RPH      . vitamin B-12 (CYANOCOBALAMIN) tablet 1,000 mcg  1,000 mcg Oral Daily Dhungel, Nishant, MD   1,000 mcg at 03/29/19 1037     Abtx:  Anti-infectives (From admission, onward)   Start     Dose/Rate Route Frequency Ordered Stop   03/30/19 1800  vancomycin (VANCOREADY) IVPB 1750 mg/350 mL     1,750 mg 175 mL/hr over 120 Minutes Intravenous Every 24 hours 03/29/19 1606     03/29/19 1700  vancomycin (VANCOREADY) IVPB 2000 mg/400 mL     2,000 mg 200 mL/hr over 120 Minutes Intravenous  Once 03/29/19 1606 03/29/19 2007   03/27/19 1200  Ampicillin-Sulbactam (UNASYN) 3 g in sodium chloride 0.9 % 100 mL IVPB     3 g 200 mL/hr over 30 Minutes Intravenous Every 6 hours 03/27/19 0802     03/27/19 1000  remdesivir 100 mg in sodium chloride 0.9 % 100 mL IVPB     100 mg 200 mL/hr over 30 Minutes Intravenous Daily 03/26/19 0126 03/31/19 0959   03/26/19 2345  ceFAZolin (ANCEF) IVPB 2g/100 mL premix  Status:  Discontinued     2 g 200 mL/hr over 30 Minutes Intravenous Every 8 hours 03/26/19 2257 03/27/19 0801   03/26/19 0800  azithromycin (ZITHROMAX) 500 mg in sodium chloride 0.9 % 250 mL IVPB  Status:  Discontinued     500 mg 250 mL/hr over 60 Minutes Intravenous Every 24 hours 03/26/19 0120 03/26/19 2255   03/26/19 0600  piperacillin-tazobactam (ZOSYN) IVPB 3.375 g   Status:  Discontinued     3.375 g 12.5 mL/hr over 240 Minutes Intravenous Every 8 hours 03/26/19 0124 03/27/19 0801   03/26/19 0300  remdesivir 200 mg in sodium chloride 0.9% 250 mL IVPB     200 mg 580 mL/hr over 30 Minutes Intravenous Once 03/26/19 0126 03/26/19 0721   03/26/19 0130  cefTRIAXone (ROCEPHIN) 2 g in sodium chloride 0.9 % 100 mL IVPB  Status:  Discontinued     2 g 200 mL/hr over 30 Minutes Intravenous Every 24 hours 03/26/19 0120 03/26/19 0121   03/26/19 0130  piperacillin-tazobactam (ZOSYN) IVPB 3.375 g  Status:  Discontinued     3.375 g 100 mL/hr over 30 Minutes Intravenous Every 8 hours 03/26/19 0121 03/26/19 0123   03/26/19 0030  vancomycin (VANCOCIN) IVPB 1000 mg/200 mL premix     1,000 mg 200 mL/hr over 60 Minutes Intravenous  Once 03/25/19 2226 03/26/19 0144   03/25/19 2245  ceFEPIme (MAXIPIME) 2 g in sodium chloride 0.9 % 100 mL IVPB     2 g 200 mL/hr over 30 Minutes Intravenous  Once 03/25/19 2223 03/26/19 0011   03/25/19 2230  metroNIDAZOLE (FLAGYL) IVPB 500 mg     500 mg 100 mL/hr over 60 Minutes Intravenous  Once 03/25/19 2223 03/26/19 0011   03/25/19 2230  vancomycin (  VANCOCIN) IVPB 1000 mg/200 mL premix     1,000 mg 200 mL/hr over 60 Minutes Intravenous  Once 03/25/19 2223 03/26/19 0144      REVIEW OF SYSTEMS:   NA Objective:  VITALS:  BP (!) 158/96 (BP Location: Right Arm)   Pulse 91   Temp 98.1 F (36.7 C) (Oral)   Resp 19   Ht 5\' 11"  (1.803 m)   Wt 113 kg   SpO2 95%   BMI 34.75 kg/m  PHYSICAL EXAM:  General: Awake, alert but does not respond to commands- moves limbs , being fed, non verbal for most part, looks young for his age Head: Normocephalic, without obvious abnormality, atraumatic. Eyes: Conjunctivae clear, anicteric sclerae. Pupils are equal ENT cannot be examined Neck: Supple,  Back: No CVA tenderness. Lungs: b/l air entry- decreased bases. Heart: s1s2 Abdomen: Soft, non-tender,not distended. Bowel sounds normal. No  masses Extremities:rt knee area soft tissue, skin- erythematous, tender, warm     Skin:drys scaly skin in many parts like legs, arms       Lymph: Cervical, supraclavicular normal. Neurologic: moves limbs spontaneously but cannot assess CNS Pertinent Labs Lab Results CBC    Component Value Date/Time   WBC 10.1 03/29/2019 0732   RBC 4.46 03/29/2019 0732   HGB 13.0 03/29/2019 0732   HCT 40.0 03/29/2019 0732   PLT 288 03/29/2019 0732   MCV 89.7 03/29/2019 0732   MCH 29.1 03/29/2019 0732   MCHC 32.5 03/29/2019 0732   RDW 12.8 03/29/2019 0732   LYMPHSABS 1.0 03/25/2019 2214   MONOABS 0.7 03/25/2019 2214   EOSABS 0.0 03/25/2019 2214   BASOSABS 0.0 03/25/2019 2214    CMP Latest Ref Rng & Units 03/29/2019 03/28/2019 03/27/2019  Glucose 70 - 99 mg/dL 03/29/2019) 825(K) 93  BUN 8 - 23 mg/dL 16 21 18   Creatinine 0.61 - 1.24 mg/dL 539(J 6.73  Sodium 135 - 145 mmol/L 139 137 146(H)  Potassium 3.5 - 5.1 mmol/L 3.6 4.5 3.7  Chloride 98 - 111 mmol/L 105 104 110  CO2 22 - 32 mmol/L 27 21(L) 27  Calcium 8.9 - 10.3 mg/dL 4.19) 3.79) 0.2(I)  Total Protein 6.5 - 8.1 g/dL 6.3(L) 6.7 6.3(L)  Total Bilirubin 0.3 - 1.2 mg/dL 0.7 0.6 0.6  Alkaline Phos 38 - 126 U/L 63 79 64  AST 15 - 41 U/L 27 34 36  ALT 0 - 44 U/L 16 19 17       Microbiology: Recent Results (from the past 240 hour(s))  Blood Culture (routine x 2)     Status: Abnormal (Preliminary result)   Collection Time: 03/25/19 10:14 PM   Specimen: BLOOD  Result Value Ref Range Status   Specimen Description   Final    BLOOD LEFT ANTECUBITAL Performed at [facility], [address] [place], [facility] [address]    Special Requests   Final    BOTTLES DRAWN AEROBIC AND ANAEROBIC Blood Culture adequate volume Performed at [facility], [address], [place], [facility] [address]    Culture  Setup Time   Final    GRAM POSITIVE COCCI IN BOTH AEROBIC AND ANAEROBIC BOTTLES CRITICAL RESULT CALLED TO, READ BACK BY  AND VERIFIED WITH: SCOTT HALL AT [place] 03/27/19 SDR    Culture (A)  Final    STAPHYLOCOCCUS EPIDERMIDIS REPEATING SUSCEPTIBILITIES TO FOLLOW Performed at [facility], [address], [place], [facility] [address]    Report Status PENDING  Incomplete  Respiratory Panel by RT PCR (Flu A&B,  Covid) - Nasopharyngeal Swab     Status: Abnormal   Collection Time: 03/25/19 10:14 PM   Specimen: Nasopharyngeal Swab  Result Value Ref Range Status   SARS Coronavirus 2 by RT PCR POSITIVE (A) NEGATIVE Final    Comment: RESULT CALLED TO, READ BACK BY AND VERIFIED WITH: sarah mclendon at [place] on 03/26/19 rww (NOTE) SARS-CoV-2 target nucleic acids are DETECTED. SARS-CoV-2 RNA is generally detectable in upper respiratory specimens  during the acute phase of infection. Positive results are indicative of the presence of the identified virus, but do not rule out bacterial infection or co-infection with other pathogens not detected by the test. Clinical correlation with patient history and other diagnostic information is necessary to determine patient infection status. The expected result is Negative. Fact Sheet for Patients:  PinkCheek.be Fact Sheet for Healthcare Providers: GravelBags.it This test is not yet approved or cleared by the [place] FDA and  has been authorized for detection and/or diagnosis of SARS-CoV-2 by FDA under an Emergency Use Authorization (EUA).  This EUA will remain in effect (meaning this test can be used)  for the duration of  the COVID-19 declaration under Section 564(b)(1) of the Act, 21 U.S.C. section 360bbb-3(b)(1), unless the authorization is terminated or revoked sooner.    Influenza A by PCR NEGATIVE NEGATIVE Final   Influenza B by PCR NEGATIVE NEGATIVE Final    Comment: (NOTE) The Xpert Xpress SARS-CoV-2/FLU/RSV assay is intended as an aid in  the diagnosis of influenza from Nasopharyngeal swab  specimens and  should not be used as a sole basis for treatment. Nasal washings and  aspirates are unacceptable for Xpert Xpress SARS-CoV-2/FLU/RSV  testing. Fact Sheet for Patients: PinkCheek.be Fact Sheet for Healthcare Providers: GravelBags.it This test is not yet approved or cleared by the [place] FDA and  has been authorized for detection and/or diagnosis of SARS-CoV-2 by  FDA under an Emergency Use Authorization (EUA). This EUA will remain  in effect (meaning this test can be used) for the duration of the  Covid-19 declaration under Section 564(b)(1) of the Act, 21  U.S.C. section 360bbb-3(b)(1), unless the authorization is  terminated or revoked. Performed at [facility], [address] [place], [place] [address]   Blood Culture ID Panel (Reflexed)     Status: Abnormal   Collection Time: 03/25/19 10:14 PM  Result Value Ref Range Status   Enterococcus species NOT DETECTED NOT DETECTED Final   Listeria monocytogenes NOT DETECTED NOT DETECTED Final   Staphylococcus species DETECTED (A) NOT DETECTED Final    Comment: Methicillin (oxacillin) susceptible coagulase negative staphylococcus. Possible blood culture contaminant (unless isolated from more than one blood culture draw or clinical case suggests pathogenicity). No antibiotic treatment is indicated for blood  culture contaminants. CRITICAL RESULT CALLED TO, READ BACK BY AND VERIFIED WITH: SCOTT HALL AT 0509 03/27/19 SDR    Staphylococcus aureus (BCID) NOT DETECTED NOT DETECTED Final   Methicillin resistance NOT DETECTED NOT DETECTED Final   Streptococcus species NOT DETECTED NOT DETECTED Final   Streptococcus agalactiae NOT DETECTED NOT DETECTED Final   Streptococcus pneumoniae NOT DETECTED NOT DETECTED Final   Streptococcus pyogenes NOT DETECTED NOT DETECTED Final   Acinetobacter baumannii NOT DETECTED NOT DETECTED Final   Enterobacteriaceae species  NOT DETECTED NOT DETECTED Final   Enterobacter cloacae complex NOT DETECTED NOT DETECTED Final   Escherichia coli NOT DETECTED NOT DETECTED Final   Klebsiella oxytoca NOT DETECTED NOT DETECTED Final   Klebsiella pneumoniae NOT DETECTED NOT DETECTED  Final   Proteus species NOT DETECTED NOT DETECTED Final   Serratia marcescens NOT DETECTED NOT DETECTED Final   Haemophilus influenzae NOT DETECTED NOT DETECTED Final   Neisseria meningitidis NOT DETECTED NOT DETECTED Final   Pseudomonas aeruginosa NOT DETECTED NOT DETECTED Final   Candida albicans NOT DETECTED NOT DETECTED Final   Candida glabrata NOT DETECTED NOT DETECTED Final   Candida krusei NOT DETECTED NOT DETECTED Final   Candida parapsilosis NOT DETECTED NOT DETECTED Final   Candida tropicalis NOT DETECTED NOT DETECTED Final    Comment: Performed at [facility], [address] [place], [place] [address]  Blood Culture (routine x 2)     Status: Abnormal (Preliminary result)   Collection Time: 03/25/19 10:19 PM   Specimen: BLOOD  Result Value Ref Range Status   Specimen Description   Final    BLOOD LEFT FOREARM Performed at [facility], [address] [place], [place] [address]    Special Requests   Final    BOTTLES DRAWN AEROBIC AND ANAEROBIC Blood Culture adequate volume Performed at [facility], [address] [place], [place] [address]    Culture  Setup Time   Final    AEROBIC BOTTLE ONLY GRAM POSITIVE COCCI CRITICAL RESULT CALLED TO, READ BACK BY AND VERIFIED WITH: SCOTT HALL ON 03/27/19 AT 0300 [facility] Performed at [facility], [address] [place], [place] [address]    Culture (A)  Final    STAPHYLOCOCCUS EPIDERMIDIS SUSCEPTIBILITIES TO FOLLOW Performed at [facility], [address] [place], [place] [address]    Report Status PENDING  Incomplete  Urine Culture     Status: None   Collection Time: 03/26/19  6:12 PM   Specimen: Urine, Clean Catch  Result Value Ref  Range Status   Specimen Description   Final    URINE, CLEAN CATCH Performed at [facility], [address] [place], [place] [address]    Special Requests   Final    NONE Performed at [facility], [address] [place], [place] [address]    Culture   Final    NO GROWTH Performed at [facility], [address] [place], [place] [address]    Report Status 03/28/2019 FINAL  Final    IMAGING RESULTS:   CXR -b/l lower lobe infiltrate VS atelecatsis I have personally reviewed the films No acute intracranial abnormality. 2. Atrophy with chronic small vessel white matter ischemic disease ? Impression/Recommendation  Staph epidermidis bacteremia 3/4- possible pathogen as the skin has many areas  of erythema/dryness /scaling- Rt knee skin and soft tissue VS knee joint infection Add vancomycin Repeat cultures Assess the knee   ? ?COVID 19 illness- on remdisivir and steroid- management as per primary team  Dementia/memory loss ? ___________________________________________________ Discussed with  requesting provider

## 2019-03-29 NOTE — Progress Notes (Addendum)
PROGRESS NOTE                                                                                                                                                                                                             Patient Demographics:    Max [name], is a 86 y.o. male, DOB - 01-Sep-1929, JAS:[id number]  Admit date - 03/25/2019   Admitting Physician Andris Baumann, MD  Outpatient Primary MD for the patient is [facility]  LOS - 3    Chief Complaint  Patient presents with  . Altered Mental Status       Brief Narrative 86 year old male with severe dementia, insulin-dependent type 2 diabetes mellitus, GERD, history of TIA, hypertension brought by EMS with altered mental status.  As per daughter patient was diagnosed with COVID-19 2 weeks prior and had symptoms of diarrhea only. In the ED patient was somnolent with low-grade fever of 100 F, elevated blood pressure 163/93 mmHg, tachycardic in the low 110s, tachypneic and O2 sat of 88% on room air.  He had normal WBC and lactic acid.  Covid PCR was positive, negative for influenza and mildly elevated troponin.  Chest x-ray showed cardiomegaly with vascular congestion with bilateral atelectasis versus developing infiltrate. Patient started on IV fluid bolus with empiric vancomycin, cefepime and Flagyl with concern for sepsis.    Subjective:   Denies difficulty breathing.  Confused at baseline.   Assessment  & Plan :    Principal Problem:    Sepsis (HCC) Acute respiratory failure with hypoxia with COVID-19 infection (HCC) Sepsis currently resolved.  Acute encephalopathy seems to have resolved and patient at baseline dementia (moderate to severe) Stable on room air.  Continue IV remdesivir (day 4/5) and IV Decadron (day 4/10).  Continue vitamin C and zinc.  On empiric Unasyn for possible aspiration.  Has staph epidermidis on both the aerobic bottles.   Will consult ID for duration of antibiotic. Follow 2D echo to rule out any vegetation.    Active problems Toxic metabolic encephalopathy Likely secondary to sepsis.  Has baseline moderate to severe dementia and encephalopathy currently resolved.  Work-up including head CT, UA, B12, RPR and ammonia unremarkable. Appreciate SLP eval.  Tolerating dysphagia diet.  Aspiration pneumonia with bacteremia On empiric Unasyn.  Check 2D echo for vegetation.   Type 2 diabetes mellitus with  long-term use of insulin, uncontrolled with hyperglycemia A1c of 8.8.  Sliding scale switched to sensitive given low normal blood sugar yesterday but now hyperglycemic in the 400s.  Added back Lantus with premeal aspart and switch to moderate sliding scale coverage.  History of TIA CT head negative.  Continue home meds   ?  Chronic anticoagulation Medication list shows patient being on Eliquis.  Daughter unaware.  Monitor off of anticoagulation for now  Essential hypertension Stable.  Resume meds  Dementia without behavioral disturbance Continue Aricept.   Code Status : DNR  Family Communication  : Daughter updated  Disposition Plan  : Possibly return back to [facility] (memory care).  Pending completion of remdesivir on 1/27 and PT evaluation.  Barriers For Discharge : Active symptoms  Consults  : None  Procedures  : CT head, EEG 2D echo  DVT Prophylaxis  :  Lovenox -   Lab Results  Component Value Date   PLT 288 03/29/2019    Antibiotics  :   Anti-infectives (From admission, onward)   Start     Dose/Rate Route Frequency Ordered Stop   03/27/19 1200  Ampicillin-Sulbactam (UNASYN) 3 g in sodium chloride 0.9 % 100 mL IVPB     3 g 200 mL/hr over 30 Minutes Intravenous Every 6 hours 03/27/19 0802     03/27/19 1000  remdesivir 100 mg in sodium chloride 0.9 % 100 mL IVPB     100 mg 200 mL/hr over 30 Minutes Intravenous Daily 03/26/19 0126 03/31/19 0959   03/26/19 2345  ceFAZolin (ANCEF)  IVPB 2g/100 mL premix  Status:  Discontinued     2 g 200 mL/hr over 30 Minutes Intravenous Every 8 hours 03/26/19 2257 03/27/19 0801   03/26/19 0800  azithromycin (ZITHROMAX) 500 mg in sodium chloride 0.9 % 250 mL IVPB  Status:  Discontinued     500 mg 250 mL/hr over 60 Minutes Intravenous Every 24 hours 03/26/19 0120 03/26/19 2255   03/26/19 0600  piperacillin-tazobactam (ZOSYN) IVPB 3.375 g  Status:  Discontinued     3.375 g 12.5 mL/hr over 240 Minutes Intravenous Every 8 hours 03/26/19 0124 03/27/19 0801   03/26/19 0300  remdesivir 200 mg in sodium chloride 0.9% 250 mL IVPB     200 mg 580 mL/hr over 30 Minutes Intravenous Once 03/26/19 0126 03/26/19 0721   03/26/19 0130  cefTRIAXone (ROCEPHIN) 2 g in sodium chloride 0.9 % 100 mL IVPB  Status:  Discontinued     2 g 200 mL/hr over 30 Minutes Intravenous Every 24 hours 03/26/19 0120 03/26/19 0121   03/26/19 0130  piperacillin-tazobactam (ZOSYN) IVPB 3.375 g  Status:  Discontinued     3.375 g 100 mL/hr over 30 Minutes Intravenous Every 8 hours 03/26/19 0121 03/26/19 0123   03/26/19 0030  vancomycin (VANCOCIN) IVPB 1000 mg/200 mL premix     1,000 mg 200 mL/hr over 60 Minutes Intravenous  Once 03/25/19 2226 03/26/19 0144   03/25/19 2245  ceFEPIme (MAXIPIME) 2 g in sodium chloride 0.9 % 100 mL IVPB     2 g 200 mL/hr over 30 Minutes Intravenous  Once 03/25/19 2223 03/26/19 0011   03/25/19 2230  metroNIDAZOLE (FLAGYL) IVPB 500 mg     500 mg 100 mL/hr over 60 Minutes Intravenous  Once 03/25/19 2223 03/26/19 0011   03/25/19 2230  vancomycin (VANCOCIN) IVPB 1000 mg/200 mL premix     1,000 mg 200 mL/hr over 60 Minutes Intravenous  Once 03/25/19 2223 03/26/19 0144  Objective:   Vitals:   03/28/19 1616 03/29/19 0013 03/29/19 0248 03/29/19 0910  BP: (!) 117/111 (!) 148/48  (!) 158/96  Pulse: 63 71  91  Resp: (!) 22 (!) 21 19   Temp: 98 F (36.7 C) 97.8 F (36.6 C)  98.1 F (36.7 C)  TempSrc: Oral Oral  Oral  SpO2: 94% 95%      Weight:      Height:        Wt Readings from Last 3 Encounters:  03/26/19 113 kg  08/04/17 117.9 kg  05/24/17 116.1 kg     Intake/Output Summary (Last 24 hours) at 03/29/2019 1129 Last data filed at 03/29/2019 0859 Gross per 24 hour  Intake 360 ml  Output 1500 ml  Net -1140 ml   Physical exam Not in distress HEENT: Moist mucosa Chest: Clear CVS: Normal S1-S2 GI: Soft, nondistended, nontender Musculoskeletal: Warm     Data Review:    CBC Recent Labs  Lab 03/25/19 2214 03/26/19 0828 03/27/19 0601 03/28/19 1554 03/29/19 0732  WBC 8.3 6.5 9.5 8.9 10.1  HGB 14.1 13.2 12.4* 13.7 13.0  HCT 44.4 41.4 39.0 42.5 40.0  PLT 220 210 230 286 288  MCV 91.2 92.6 92.0 90.0 89.7  MCH 29.0 29.5 29.2 29.0 29.1  MCHC 31.8 31.9 31.8 32.2 32.5  RDW 13.0 13.0 13.0 12.9 12.8  LYMPHSABS 1.0  --   --   --   --   MONOABS 0.7  --   --   --   --   EOSABS 0.0  --   --   --   --   BASOSABS 0.0  --   --   --   --     Chemistries  Recent Labs  Lab 03/25/19 2214 03/26/19 0828 03/27/19 0601 03/28/19 1554 03/29/19 0732  NA 143  --  146* 137 139  K 4.3  --  3.7 4.5 3.6  CL 104  --  110 104 105  CO2 27  --  27 21* 27  GLUCOSE 217*  --  93 388* 127*  BUN 21  --  18 21 16   CREATININE 1.08  --  0.79 0.77 0.71  CALCIUM 9.0  --  8.5* 8.5* 8.5*  MG  --  2.0  --   --   --   AST 26  --  36 34 27  ALT 20  --  17 19 16   ALKPHOS 89  --  64 79 63  BILITOT 0.9  --  0.6 0.6 0.7   ------------------------------------------------------------------------------------------------------------------ No results for input(s): CHOL, HDL, LDLCALC, TRIG, CHOLHDL, LDLDIRECT in the last 72 hours.  Lab Results  Component Value Date   HGBA1C 8.8 (H) 03/26/2019   ------------------------------------------------------------------------------------------------------------------ No results for input(s): TSH, T4TOTAL, T3FREE, THYROIDAB in the last 72 hours.  Invalid input(s):  FREET3 ------------------------------------------------------------------------------------------------------------------ Recent Labs    03/26/19 1458  VITAMINB12 2,299*    Coagulation profile Recent Labs  Lab 03/25/19 2214 03/26/19 0536  INR 1.1 1.1    No results for input(s): DDIMER in the last 72 hours.  Cardiac Enzymes No results for input(s): CKMB, TROPONINI, MYOGLOBIN in the last 168 hours.  Invalid input(s): CK ------------------------------------------------------------------------------------------------------------------    Component Value Date/Time   BNP 50.0 03/26/2019 0030    Inpatient Medications  Scheduled Meds: . ascorbic acid  500 mg Oral Daily  . brimonidine  1 drop Right Eye BID   And  . timolol  1 drop Right Eye BID  .  cholecalciferol  2,000 Units Oral Daily  . cycloSPORINE  1 drop Right Eye BID  . darifenacin  7.5 mg Oral Daily  . dexamethasone (DECADRON) injection  6 mg Intravenous Q24H  . donepezil  10 mg Oral QHS  . DULoxetine  60 mg Oral Daily  . enoxaparin (LOVENOX) injection  40 mg Subcutaneous Q24H  . feeding supplement (ENSURE ENLIVE)  237 mL Oral BID BM  . insulin aspart  0-15 Units Subcutaneous TID WC  . insulin aspart  5 Units Subcutaneous TID WC  . insulin glargine  20 Units Subcutaneous QHS  . latanoprost  1 drop Right Eye QHS  . losartan  100 mg Oral Daily  . memantine  10 mg Oral BID  . mirabegron ER  50 mg Oral Daily  . multivitamin with minerals  1 tablet Oral Daily  . pantoprazole  40 mg Oral Daily  . sertraline  50 mg Oral Daily  . traZODone  75 mg Oral QHS  . vitamin B-12  1,000 mcg Oral Daily   Continuous Infusions: . ampicillin-sulbactam (UNASYN) IV 3 g (03/29/19 0549)  . remdesivir 100 mg in NS 100 mL 100 mg (03/29/19 1034)   PRN Meds:.acetaminophen **OR** acetaminophen, bisacodyl, fluticasone, ondansetron **OR** ondansetron (ZOFRAN) IV, senna-docusate  Micro Results Recent Results (from the past 240 hour(s))   Blood Culture (routine x 2)     Status: Abnormal (Preliminary result)   Collection Time: 03/25/19 10:14 PM   Specimen: BLOOD  Result Value Ref Range Status   Specimen Description   Final    BLOOD LEFT ANTECUBITAL Performed at [facility], [address], [place], [place] [address]    Special Requests   Final    BOTTLES DRAWN AEROBIC AND ANAEROBIC Blood Culture adequate volume Performed at [facility], [address] [place], [place] [address]    Culture  Setup Time   Final    GRAM POSITIVE COCCI IN BOTH AEROBIC AND ANAEROBIC BOTTLES CRITICAL RESULT CALLED TO, READ BACK BY AND VERIFIED WITH: [place] AT 5809 03/27/19 SDR    Culture (A)  Final    STAPHYLOCOCCUS EPIDERMIDIS REPEATING SUSCEPTIBILITIES TO FOLLOW Performed at [facility], [place] [address] [place], [place] [address]    Report Status PENDING  Incomplete  Respiratory Panel by RT PCR (Flu A&B, Covid) - Nasopharyngeal Swab     Status: Abnormal   Collection Time: 03/25/19 10:14 PM   Specimen: Nasopharyngeal Swab  Result Value Ref Range Status   SARS Coronavirus 2 by RT PCR POSITIVE (A) NEGATIVE Final    Comment: RESULT CALLED TO, READ BACK BY AND VERIFIED WITH: sarah mclendon at [place] on 03/26/19 rww (NOTE) SARS-CoV-2 target nucleic acids are DETECTED. SARS-CoV-2 RNA is generally detectable in upper respiratory specimens  during the acute phase of infection. Positive results are indicative of the presence of the identified virus, but do not rule out bacterial infection or co-infection with other pathogens not detected by the test. Clinical correlation with patient history and other diagnostic information is necessary to determine patient infection status. The expected result is Negative. Fact Sheet for Patients:  PinkCheek.be Fact Sheet for Healthcare Providers: GravelBags.it This test is not yet approved or cleared by the [place] FDA and  has been authorized for detection and/or diagnosis of SARS-CoV-2 by FDA under an Emergency Use Authorization (EUA).  This EUA will remain in effect (meaning this test can be used)  for the duration of  the COVID-19 declaration under Section 564(b)(1)  of the Act, 21 U.S.C. section 360bbb-3(b)(1), unless the authorization is terminated or revoked sooner.    Influenza A by PCR NEGATIVE NEGATIVE Final   Influenza B by PCR NEGATIVE NEGATIVE Final    Comment: (NOTE) The Xpert Xpress SARS-CoV-2/FLU/RSV assay is intended as an aid in  the diagnosis of influenza from Nasopharyngeal swab specimens and  should not be used as a sole basis for treatment. Nasal washings and  aspirates are unacceptable for Xpert Xpress SARS-CoV-2/FLU/RSV  testing. Fact Sheet for Patients: https://www.moore.com/https://www.fda.gov/media/142436/download Fact Sheet for Healthcare Providers: https://www.young.biz/https://www.fda.gov/media/142435/download This test is not yet approved or cleared by the [place] FDA and  has been authorized for detection and/or diagnosis of SARS-CoV-2 by  FDA under an Emergency Use Authorization (EUA). This EUA will remain  in effect (meaning this test can be used) for the duration of the  Covid-19 declaration under Section 564(b)(1) of the Act, 21  U.S.C. section 360bbb-3(b)(1), unless the authorization is  terminated or revoked. Performed at [facility], [address] [place], [place] [address]   Blood Culture ID Panel (Reflexed)     Status: Abnormal   Collection Time: 03/25/19 10:14 PM  Result Value Ref Range Status   Enterococcus species NOT DETECTED NOT DETECTED Final   Listeria monocytogenes NOT DETECTED NOT DETECTED Final   Staphylococcus species DETECTED (A) NOT DETECTED Final    Comment: Methicillin (oxacillin) susceptible coagulase negative staphylococcus. Possible blood culture contaminant (unless isolated from more than one blood culture draw or clinical case suggests  pathogenicity). No antibiotic treatment is indicated for blood  culture contaminants. CRITICAL RESULT CALLED TO, READ BACK BY AND VERIFIED WITH: SCOTT HALL AT 0509 03/27/19 SDR    Staphylococcus aureus (BCID) NOT DETECTED NOT DETECTED Final   Methicillin resistance NOT DETECTED NOT DETECTED Final   Streptococcus species NOT DETECTED NOT DETECTED Final   Streptococcus agalactiae NOT DETECTED NOT DETECTED Final   Streptococcus pneumoniae NOT DETECTED NOT DETECTED Final   Streptococcus pyogenes NOT DETECTED NOT DETECTED Final   Acinetobacter baumannii NOT DETECTED NOT DETECTED Final   Enterobacteriaceae species NOT DETECTED NOT DETECTED Final   Enterobacter cloacae complex NOT DETECTED NOT DETECTED Final   Escherichia coli NOT DETECTED NOT DETECTED Final   Klebsiella oxytoca NOT DETECTED NOT DETECTED Final   Klebsiella pneumoniae NOT DETECTED NOT DETECTED Final   Proteus species NOT DETECTED NOT DETECTED Final   Serratia marcescens NOT DETECTED NOT DETECTED Final   Haemophilus influenzae NOT DETECTED NOT DETECTED Final   Neisseria meningitidis NOT DETECTED NOT DETECTED Final   Pseudomonas aeruginosa NOT DETECTED NOT DETECTED Final   Candida albicans NOT DETECTED NOT DETECTED Final   Candida glabrata NOT DETECTED NOT DETECTED Final   Candida krusei NOT DETECTED NOT DETECTED Final   Candida parapsilosis NOT DETECTED NOT DETECTED Final   Candida tropicalis NOT DETECTED NOT DETECTED Final    Comment: Performed at [facility], [address] [place], [place] [address]  Blood Culture (routine x 2)     Status: Abnormal (Preliminary result)   Collection Time: 03/25/19 10:19 PM   Specimen: BLOOD  Result Value Ref Range Status   Specimen Description   Final    BLOOD LEFT FOREARM Performed at [facility], [address] [place], [place] [address]    Special Requests   Final    BOTTLES DRAWN AEROBIC AND ANAEROBIC Blood Culture adequate volume Performed at [facility], [address] [place], [place] [address]    Culture  Setup  Time   Final    AEROBIC BOTTLE ONLY GRAM POSITIVE COCCI CRITICAL RESULT CALLED TO, READ BACK BY AND VERIFIED WITH: SCOTT HALL ON 03/27/19 AT 0300 [facility] Performed at [facility], [address] [place], [place] [address]    Culture (A)  Final    STAPHYLOCOCCUS EPIDERMIDIS SUSCEPTIBILITIES TO FOLLOW Performed at [facility], [address], [place], [place] [address]    Report Status PENDING  Incomplete  Urine Culture     Status: None   Collection Time: 03/26/19  6:12 PM   Specimen: Urine, Clean Catch  Result Value Ref Range Status   Specimen Description   Final    URINE, CLEAN CATCH Performed at [facility], [address] [place], [place] [address]    Special Requests   Final    NONE Performed at [facility], [address] [place], [place] [address]    Culture   Final    NO GROWTH Performed at [facility], [address] [place], [place] [address]    Report Status 03/28/2019 FINAL  Final    Radiology Reports CT HEAD WO CONTRAST  Result Date: 03/26/2019 CLINICAL DATA:  Mental status changes and fever. EXAM: CT HEAD WITHOUT CONTRAST TECHNIQUE: Contiguous axial images were obtained from the base of the skull through the vertex without intravenous contrast. COMPARISON:  02/19/2018 FINDINGS: Brain: There is no evidence for acute hemorrhage, hydrocephalus, mass lesion, or abnormal extra-axial fluid collection. No definite CT evidence for acute infarction. Diffuse loss of parenchymal volume is consistent with atrophy. Patchy low attenuation in the deep hemispheric and periventricular white matter is nonspecific, but likely reflects chronic microvascular ischemic demyelination. Stable prominence of the lateral ventricles, presumably from central atrophy. Vascular: No hyperdense vessel or unexpected calcification. Skull: Normal. Negative for fracture or focal lesion.  No evidence for fracture. No worrisome lytic or sclerotic lesion. Sinuses/Orbits: The visualized paranasal sinuses and mastoid air cells are clear. Visualized portions of the globes and intraorbital fat are unremarkable. Other: None. IMPRESSION: 1. No acute intracranial abnormality. 2. Atrophy with chronic small vessel white matter ischemic disease. Electronically Signed   By: Kennith Center M.D.   On: 03/26/2019 16:54   DG Chest Port 1 View  Result Date: 03/25/2019 CLINICAL DATA:  86 year old male with sepsis. EXAM: PORTABLE CHEST 1 VIEW COMPARISON:  Chest radiograph dated 02/19/2018 FINDINGS: There is shallow inspiration. Cardiomegaly with vascular congestion. Bilateral lower lung field peripheral and subpleural hazy densities may represent atelectasis although developing infiltrate not excluded. Clinical correlation is recommended. No large pleural effusion or pneumothorax. No acute osseous pathology. IMPRESSION: 1. Cardiomegaly with vascular congestion. 2. Bilateral lower lung field subpleural atelectasis versus developing infiltrate. Clinical correlation is recommended. Electronically Signed   By: Elgie Collard M.D.   On: 03/25/2019 22:56    Time Spent in minutes 35   Natayla Cadenhead M.D on 03/29/2019 at 11:29 AM  Between 7am to 7pm - Pager - [phone]  After 7pm go to [email or web address] - password [facility]  [facility] -  Office  [phone]

## 2019-03-30 DIAGNOSIS — A419 Sepsis, unspecified organism: Secondary | ICD-10-CM

## 2019-03-30 DIAGNOSIS — G9340 Encephalopathy, unspecified: Secondary | ICD-10-CM

## 2019-03-30 DIAGNOSIS — R6520 Severe sepsis without septic shock: Secondary | ICD-10-CM

## 2019-03-30 LAB — COMPREHENSIVE METABOLIC PANEL
ALT: 18 U/L (ref 0–44)
AST: 26 U/L (ref 15–41)
Albumin: 2.4 g/dL — ABNORMAL LOW (ref 3.5–5.0)
Alkaline Phosphatase: 64 U/L (ref 38–126)
Anion gap: 9 (ref 5–15)
BUN: 15 mg/dL (ref 8–23)
CO2: 28 mmol/L (ref 22–32)
Calcium: 8.5 mg/dL — ABNORMAL LOW (ref 8.9–10.3)
Chloride: 103 mmol/L (ref 98–111)
Creatinine, Ser: 0.77 mg/dL (ref 0.61–1.24)
GFR calc Af Amer: >60 mL/min (ref >60)
GFR calc non Af Amer: >60 mL/min (ref >60)
Glucose, Bld: 113 mg/dL — ABNORMAL HIGH (ref 70–99)
Potassium: 4.2 mmol/L (ref 3.5–5.1)
Sodium: 140 mmol/L (ref 135–145)
Total Bilirubin: 0.5 mg/dL (ref 0.3–1.2)
Total Protein: 6.3 g/dL — ABNORMAL LOW (ref 6.5–8.1)

## 2019-03-30 LAB — CBC
HCT: 41.5 % (ref 39.0–52.0)
Hemoglobin: 13.5 g/dL (ref 13.0–17.0)
MCH: 29.1 pg (ref 26.0–34.0)
MCHC: 32.5 g/dL (ref 30.0–36.0)
MCV: 89.4 fL (ref 80.0–100.0)
Platelets: 289 10*3/uL (ref 150–400)
RBC: 4.64 MIL/uL (ref 4.22–5.81)
RDW: 12.9 % (ref 11.5–15.5)
WBC: 10.2 10*3/uL (ref 4.0–10.5)
nRBC: 0.3 % — ABNORMAL HIGH (ref 0.0–0.2)

## 2019-03-30 LAB — GLUCOSE, CAPILLARY
Glucose-Capillary: 110 mg/dL — ABNORMAL HIGH (ref 70–99)
Glucose-Capillary: 219 mg/dL — ABNORMAL HIGH (ref 70–99)
Glucose-Capillary: 275 mg/dL — ABNORMAL HIGH (ref 70–99)
Glucose-Capillary: 252 mg/dL — ABNORMAL HIGH (ref 70–99)

## 2019-03-30 LAB — FIBRIN DERIVATIVES D-DIMER (ARMC ONLY): Fibrin derivatives D-dimer (ARMC): 727.74 ng/mL (FEU) — ABNORMAL HIGH (ref 0.00–499.00)

## 2019-03-30 LAB — C-REACTIVE PROTEIN: CRP: 5.2 mg/dL — ABNORMAL HIGH (ref <1.0)

## 2019-03-30 NOTE — Progress Notes (Signed)
[facility] Progress Note  Patient: Max [name]    YCX:[id number]  DOA: 03/25/2019     Date of Service: the patient was seen and examined on 03/30/2019  Chief Complaint  Patient presents with  . Altered Mental Status   Brief hospital course: Severe dementia, insulin-dependent type 2 diabetes mellitus, GERD, history of TIA, hypertension brought by EMS with altered mental status.  As per daughter patient was diagnosed with COVID-19 2 weeks prior and had symptoms of diarrhea only. In the ED patient was somnolent with low-grade fever of 100 F, elevated blood pressure 163/93 mmHg, tachycardic in the low 110s, tachypneic and O2 sat of 88% on room air.  Currently further plan is identified IV antibiotics and monitor for improvement.  Assessment and Plan: 1.  Sepsis POA. Due to combination of acute hypoxic respiratory failure with COVID-19 illness as well as staph epidermis cellulitis with bacteremia Aspiration pneumonia Sepsis physiology currently resolved. Oxygenation improving as well. No further IV fluids. Blood cultures grew staph epidermidis Concern for the source of infection has been cellulitis. Patient also at risk for aspiration given his mental status therefore cover with IV vancomycin and Unasyn. Infectious disease consulted for further assistance. Repeat cultures ordered on 03/30/2019 will monitor results of the same. Echocardiogram negative for any vegetation.  2.  Acute metabolic encephalopathy Delirium in the setting of infection in patient with dementia. No behavioral disturbances. Work-up including CT head, UA, B12 RPR and ammonia unremarkable. Appreciate speech therapy evaluation. On dysphagia 3 diet with chopped meat and gravy.  Thin liquid.  Medication crushed with pure.  3.  Type II DM, long-term insulin use, uncontrolled with hyperglycemia with vascular abnormality A1c of 8.8 Currently on sliding scale insulin. Due to higher glucose level will increase  premeal coverage. Continue Lantus for now.  4.  History of TIA Patient does not have focal deficit. CT head unremarkable. Monitor.  5. Medical reconciliation error with Eliquis Patient's PTA med list shows presence of Eliquis. Family is not aware of any anticoagulation. No indication identified on the chart review as well. Based on the last report from patient's neurologist patient is not on any anticoagulation back then as well. Currently off of any anticoagulation on DVT prophylaxis with Lovenox.  6.  Essential hypertension. Blood pressure stable.  Resuming.  7.  Obesity. Outpatient dietary consultation. Body mass index is 34.75 kg/m.   Diet: Carb modified diet DVT Prophylaxis: Subcutaneous Lovenox   Advance goals of care discussion: DNR  Family Communication: no family was present at bedside, at the time of interview.   Disposition:  Pt is from [facility], admitted with confusion and sepsis, still has confusion as well as concern for bacteremia, which precludes a safe discharge. Discharge to ALF, when repeat cultures are negative.  Subjective: No acute complaint no acute events.  Physical Exam: General:  alert not oriented to time, place, and person.  Appear in mild distress, affect appropriate Eyes: PERRL ENT: Oral Mucosa Clear, moist  Neck: no JVD,  Cardiovascular: S1 and S2 Present, no Murmur,  Respiratory: good respiratory effort, Bilateral Air entry equal and Decreased, bilateral  Crackles, no wheezes Abdomen: Bowel Sound present, Soft and no tenderness,  Skin: no rash Extremities: no Pedal edema, no calf tenderness Neurologic: without any new focal findings Gait not checked due to patient safety concerns  Vitals:   03/29/19 0910 03/30/19 0012 03/30/19 0909 03/30/19 1550  BP: (!) 158/96 (!) 146/89 (!) 175/91 95/80  Pulse: 91 79 75 73  Resp:  20 20   Temp: 98.1 F (36.7 C) 98 F (36.7 C)    TempSrc: Oral Oral    SpO2:  93%  94%  Weight:        Height:        Intake/Output Summary (Last 24 hours) at 03/30/2019 1654 Last data filed at 03/30/2019 0900 Gross per 24 hour  Intake 1120.96 ml  Output 1800 ml  Net -679.04 ml   Filed Weights   03/25/19 2200 03/26/19 0309  Weight: 127 kg 113 kg    Data Reviewed: I have personally reviewed and interpreted daily labs, tele strips, imagings as discussed above. I reviewed all nursing notes, pharmacy notes, vitals, pertinent old records I have discussed plan of care as described above with RN and patient/family.  CBC: Recent Labs  Lab 03/25/19 2214 03/25/19 2214 03/26/19 0828 03/27/19 0601 03/28/19 1554 03/29/19 0732 03/30/19 0519  WBC 8.3   < > 6.5 9.5 8.9 10.1 10.2  NEUTROABS 6.5  --   --   --   --   --   --   HGB 14.1   < > 13.2 12.4* 13.7 13.0 13.5  HCT 44.4   < > 41.4 39.0 42.5 40.0 41.5  MCV 91.2   < > 92.6 92.0 90.0 89.7 89.4  PLT 220   < > 210 230 286 288 289   < > = values in this interval not displayed.   Basic Metabolic Panel: Recent Labs  Lab 03/25/19 2214 03/26/19 0828 03/27/19 0601 03/28/19 1554 03/29/19 0732 03/30/19 0519  NA 143  --  146* 137 139 140  K 4.3  --  3.7 4.5 3.6 4.2  CL 104  --  110 104 105 103  CO2 27  --  27 21* 27 28  GLUCOSE 217*  --  93 388* 127* 113*  BUN 21  --  18 21 16 15   CREATININE 1.08  --  0.79 0.77 0.71 0.77  CALCIUM 9.0  --  8.5* 8.5* 8.5* 8.5*  MG  --  2.0  --   --   --   --     Studies: No results found.  Scheduled Meds: . ascorbic acid  500 mg Oral Daily  . brimonidine  1 drop Right Eye BID   And  . timolol  1 drop Right Eye BID  . cholecalciferol  2,000 Units Oral Daily  . cycloSPORINE  1 drop Right Eye BID  . darifenacin  7.5 mg Oral Daily  . dexamethasone (DECADRON) injection  6 mg Intravenous Q24H  . donepezil  10 mg Oral QHS  . DULoxetine  60 mg Oral Daily  . enoxaparin (LOVENOX) injection  40 mg Subcutaneous Q24H  . feeding supplement (ENSURE ENLIVE)  237 mL Oral BID BM  . insulin aspart  0-15  Units Subcutaneous TID WC  . insulin aspart  5 Units Subcutaneous TID WC  . insulin detemir  20 Units Subcutaneous QHS  . latanoprost  1 drop Right Eye QHS  . losartan  100 mg Oral Daily  . memantine  10 mg Oral BID  . mirabegron ER  50 mg Oral Daily  . multivitamin with minerals  1 tablet Oral Daily  . pantoprazole  40 mg Oral Daily  . sertraline  50 mg Oral Daily  . traZODone  75 mg Oral QHS  . vitamin B-12  1,000 mcg Oral Daily   Continuous Infusions: . ampicillin-sulbactam (UNASYN) IV 3 g (03/30/19 1632)  . vancomycin  PRN Meds: acetaminophen **OR** acetaminophen, bisacodyl, fluticasone, ondansetron **OR** ondansetron (ZOFRAN) IV, senna-docusate  Time spent: 35 minutes  Author: Lynden Oxford, MD [facility] 03/30/2019 4:54 PM  To reach On-call, see care teams to locate the attending and reach out to them via [email or web address]. If 7PM-7AM, please contact night-coverage If you still have difficulty reaching the attending provider, please page the [facility] (Director on Call) for [facility] on amion for assistance.

## 2019-03-30 NOTE — Progress Notes (Signed)
Inpatient Diabetes Program Recommendations  AACE/ADA: New Consensus Statement on Inpatient Glycemic Control (2015)  Target Ranges:  Prepandial:   less than 140 mg/dL      Peak postprandial:   less than 180 mg/dL (1-2 hours)      Critically ill patients:  140 - 180 mg/dL   Lab Results  Component Value Date   GLUCAP 219 (H) 03/30/2019   HGBA1C 8.8 (H) 03/26/2019    Review of Glycemic Control Results for Max, [name] (MRN [id number]) as of 03/30/2019 13:19  Ref. Range 03/30/2019 11:19  Glucose-Capillary Latest Ref Range: 70 - 99 mg/dL 317 (H)   Diabetes history: DM2 Outpatient Diabetes medications: Lantus 15 units bid + Novolog 2-14 units tid meal coverage + Glucotrol XL 20 mg qd + Actos 15 mg Current orders for Inpatient glycemic control: Lantus 20 units + Novolog 5 units tid meal coverager + Novolog moderate q 4 hrs.  Inpatient Diabetes Program Recommendations:   Noted postprandial CBGs elevated. -Increase Novolog meal coverage to 8 units tid if eats 50%  Thank you, Billy Fischer. Dejha King, RN, MSN, CDE  Diabetes Coordinator Inpatient Glycemic Control Team Team Pager [phone] (8am-5pm) 03/30/2019 1:21 PM

## 2019-03-30 NOTE — TOC Progression Note (Addendum)
Transition of Care [facility]) - Progression Note    Patient Details  Name: Max [name] MRN: [id number] Date of Birth: 1929/05/16  Transition of Care [facility]) CM/SW Contact  Nivek Powley, Lemar Livings, LCSW Phone Number: 03/30/2019, 2:40 PM  Clinical Narrative: Spoke with [facility] to inform of PT recommendation of return and no follow up therapy recommended. Aware not currently medically ready for transfer. Contacted daughter-Melissa to let her know also to transfer back to [facility] when medically ready. She reports the nurses are really good about updating her daily and she really appreciates it.   FL2 started in chart.  Will need EMS transport back to facility     Expected Discharge Plan: Assisted Living Barriers to Discharge: Continued Medical Work up  Expected Discharge Plan and Services Expected Discharge Plan: Assisted Living In-house Referral: Clinical Social Work Discharge Planning Services: NA   Living arrangements for the past 2 months: Assisted Living Facility                                       Social Determinants of Health (SDOH) Interventions    Readmission Risk Interventions No flowsheet data found.

## 2019-03-31 ENCOUNTER — Inpatient Hospital Stay: Payer: Medicare Other

## 2019-03-31 DIAGNOSIS — L03115 Cellulitis of right lower limb: Secondary | ICD-10-CM

## 2019-03-31 LAB — GLUCOSE, CAPILLARY
Glucose-Capillary: 93 mg/dL (ref 70–99)
Glucose-Capillary: 171 mg/dL — ABNORMAL HIGH (ref 70–99)
Glucose-Capillary: 336 mg/dL — ABNORMAL HIGH (ref 70–99)
Glucose-Capillary: 364 mg/dL — ABNORMAL HIGH (ref 70–99)

## 2019-03-31 MED ORDER — HYDRALAZINE HCL 50 MG PO TABS
25.0000 mg | ORAL_TABLET | Freq: Three times a day (TID) | ORAL | Status: DC | PRN
  Administered 2019-03-31: 03:00:00 25 mg via ORAL
  Filled 2019-03-31: qty 1

## 2019-03-31 NOTE — Progress Notes (Signed)
pts family contact updated

## 2019-03-31 NOTE — Progress Notes (Addendum)
[facility] Progress Note  Patient: Max [name]    NTI:[id number]  DOA: 03/25/2019     Date of Service: the patient was seen and examined on 03/31/2019  Chief Complaint  Patient presents with  . Altered Mental Status   Brief hospital course: Severe dementia, insulin-dependent type 2 diabetes mellitus, GERD, history of TIA, hypertension brought by EMS with altered mental status. As per daughter patient was diagnosed with COVID-19 2 weeks prior and had symptoms of diarrhea only. In the ED patient was somnolent with low-grade fever of 100 F, elevated blood pressure 163/93 mmHg, tachycardic in the low 110s, tachypneic and O2 sat of 88% on room air.  Currently further plan is identified IV antibiotics and monitor for improvement.  Assessment and Plan: 1.  Sepsis POA. Due to combination of acute hypoxic respiratory failure with COVID-19 illness as well as staph epidermis cellulitis with bacteremia Aspiration pneumonia Sepsis physiology currently resolved. Oxygenation improving as well. No further IV fluids. Blood cultures grew staph epidermidis Concern for the source of infection has been cellulitis. Patient also at risk for aspiration given his mental status therefore cover with IV vancomycin and Unasyn. Infectious disease consulted for further assistance. Repeat cultures ordered on 03/30/2019 so far negative.  Also earlier culture sent to [facility] for further sensitivities.  Echocardiogram negative for any vegetation.  2.  Acute metabolic encephalopathy Delirium in the setting of infection in patient with dementia. No behavioral disturbances. Work-up including CT head, UA, B12 RPR and ammonia unremarkable. Appreciate speech therapy evaluation. On dysphagia 3 diet with chopped meat and gravy.  Thin liquid.  Medication crushed with pure.  3.  Type II DM, long-term insulin use, uncontrolled with hyperglycemia with vascular abnormality A1c of 8.8 Currently on sliding scale  insulin. Due to higher glucose level will increase premeal coverage. Continue Lantus for now.  4.  History of TIA Patient does not have focal deficit. CT head unremarkable. Monitor.  5. Medical reconciliation error with Eliquis Patient's PTA med list shows presence of Eliquis. Family is not aware of any anticoagulation. No indication identified on the chart review as well. Based on the last report from patient's neurologist patient is not on any anticoagulation back then as well. Currently off of any anticoagulation on DVT prophylaxis with Lovenox.  6.  Essential hypertension. Blood pressure stable.  Resuming.  7.  Obesity. Outpatient dietary consultation. Body mass index is 34.75 kg/m.   Diet: Dysphagia level 3 w/ Chopped/Minced meats w/ gravies added; Thin liquids. General aspiration precautions. Supervision during meals to assist as needed and monitor safe follow through w/ eating/drinking tasks. DVT Prophylaxis: Subcutaneous Lovenox   Advance goals of care discussion: DNR  Family Communication: no family was present at bedside, at the time of interview.   Disposition:  Pt is from [facility], admitted with confusion and sepsis, still has confusion as well as concern for bacteremia, which precludes a safe discharge. Discharge to ALF, when repeat cultures are negative.  Subjective: not interested in talking, tells me his name, denies any pain but on exam reports pain in his legs, no nausea or vomiting, no acute events.  Physical Exam: General: alert not oriented to time, place, and person.  Appear in mild distress, affect flat in affect Eyes: PERRL ENT: Oral Mucosa Clear, moist  Neck: no JVD,  Cardiovascular: S1 and S2 Present, no Murmur,  Respiratory: good respiratory effort, Bilateral Air entry equal and Decreased, no Crackles, no wheezes Abdomen: Bowel Sound present, Soft and no tenderness,  Skin:  no rash Extremities: bilateral  Pedal edema, no calf  tenderness Neurologic: without any new focal findings Gait not checked due to patient safety concerns  Vitals:   03/31/19 0224 03/31/19 0300 03/31/19 0400 03/31/19 0739  BP: (!) 185/65 (!) 181/86  (!) 171/79  Pulse: 61 (!) 56 (!) 49 65  Resp:    18  Temp:    98.9 F (37.2 C)  TempSrc:    Oral  SpO2: 98% 97% 97%   Weight:      Height:        Intake/Output Summary (Last 24 hours) at 03/31/2019 1532 Last data filed at 03/31/2019 1253 Gross per 24 hour  Intake 1140.68 ml  Output 2700 ml  Net -1559.32 ml   Filed Weights   03/25/19 2200 03/26/19 0309  Weight: 127 kg 113 kg    Data Reviewed: I have personally reviewed and interpreted daily labs, tele strips, imagings as discussed above. I reviewed all nursing notes, pharmacy notes, vitals, pertinent old records I have discussed plan of care as described above with RN and patient/family.  CBC: Recent Labs  Lab 03/25/19 2214 03/25/19 2214 03/26/19 0828 03/27/19 0601 03/28/19 1554 03/29/19 0732 03/30/19 0519  WBC 8.3   < > 6.5 9.5 8.9 10.1 10.2  NEUTROABS 6.5  --   --   --   --   --   --   HGB 14.1   < > 13.2 12.4* 13.7 13.0 13.5  HCT 44.4   < > 41.4 39.0 42.5 40.0 41.5  MCV 91.2   < > 92.6 92.0 90.0 89.7 89.4  PLT 220   < > 210 230 286 288 289   < > = values in this interval not displayed.   Basic Metabolic Panel: Recent Labs  Lab 03/25/19 2214 03/26/19 0828 03/27/19 0601 03/28/19 1554 03/29/19 0732 03/30/19 0519  NA 143  --  146* 137 139 140  K 4.3  --  3.7 4.5 3.6 4.2  CL 104  --  110 104 105 103  CO2 27  --  27 21* 27 28  GLUCOSE 217*  --  93 388* 127* 113*  BUN 21  --  18 21 16 15   CREATININE 1.08  --  0.79 0.77 0.71 0.77  CALCIUM 9.0  --  8.5* 8.5* 8.5* 8.5*  MG  --  2.0  --   --   --   --     Studies: No results found.  Scheduled Meds: . ascorbic acid  500 mg Oral Daily  . brimonidine  1 drop Right Eye BID   And  . timolol  1 drop Right Eye BID  . cholecalciferol  2,000 Units Oral Daily  .  cycloSPORINE  1 drop Right Eye BID  . darifenacin  7.5 mg Oral Daily  . dexamethasone (DECADRON) injection  6 mg Intravenous Q24H  . donepezil  10 mg Oral QHS  . DULoxetine  60 mg Oral Daily  . enoxaparin (LOVENOX) injection  40 mg Subcutaneous Q24H  . feeding supplement (ENSURE ENLIVE)  237 mL Oral BID BM  . insulin aspart  0-15 Units Subcutaneous TID WC  . insulin aspart  5 Units Subcutaneous TID WC  . insulin detemir  20 Units Subcutaneous QHS  . latanoprost  1 drop Right Eye QHS  . losartan  100 mg Oral Daily  . memantine  10 mg Oral BID  . mirabegron ER  50 mg Oral Daily  . multivitamin with minerals  1 tablet  Oral Daily  . pantoprazole  40 mg Oral Daily  . sertraline  50 mg Oral Daily  . traZODone  75 mg Oral QHS  . vitamin B-12  1,000 mcg Oral Daily   Continuous Infusions: . ampicillin-sulbactam (UNASYN) IV 3 g (03/31/19 1233)  . vancomycin Stopped (03/30/19 2056)   PRN Meds: acetaminophen **OR** acetaminophen, bisacodyl, fluticasone, hydrALAZINE, ondansetron **OR** ondansetron (ZOFRAN) IV, senna-docusate  Time spent: 35 minutes  Author: Lynden Oxford, MD [facility] 03/31/2019 3:32 PM  To reach On-call, see care teams to locate the attending and reach out to them via [email or web address]. If 7PM-7AM, please contact night-coverage If you still have difficulty reaching the attending provider, please page the [facility] (Director on Call) for [facility] on amion for assistance.

## 2019-03-31 NOTE — Progress Notes (Signed)
ID  Non verbal Looks well No distress  Patient Vitals for the past 24 hrs:  BP Temp Temp src Pulse Resp SpO2  03/31/19 0739 (!) 171/79 98.9 F (37.2 C) Oral 65 18 -  03/31/19 0400 - - - (!) 49 - 97 %  03/31/19 0300 (!) 181/86 - - (!) 56 - 97 %  03/31/19 0224 (!) 185/65 - - 61 - 98 %  03/31/19 0200 (!) 199/88 - - 64 - 96 %  03/31/19 0057 (!) 188/108 97.8 F (36.6 C) Axillary 67 18 98 %  03/30/19 1550 95/80 - - 73 - 94 %    O/e awake, but non verbal  Skin  Erythema over the chest resolved Rt knee skin erythema and swelling and tenderness resolved Now only has lacy violaceous pattern  Left chest -03/31/19   Left chest 03/29/19   Rt knee 03/31/19   Rt knee 03/29/19    CBC Latest Ref Rng & Units 03/30/2019 03/29/2019 03/28/2019  WBC 4.0 - 10.5 K/uL 10.2 10.1 8.9  Hemoglobin 13.0 - 17.0 g/dL 16.1 09.6 04.5  Hematocrit 39.0 - 52.0 % 41.5 40.0 42.5  Platelets 150 - 400 K/uL 289 288 286    CMP Latest Ref Rng & Units 03/30/2019 03/29/2019 03/28/2019  Glucose 70 - 99 mg/dL 409(W) 119(J) 478(G)  BUN 8 - 23 mg/dL 15 16 21   Creatinine 0.61 - 1.24 mg/dL 9.56 2.13  Sodium 135 - 145 mmol/L 140 139 137  Potassium 3.5 - 5.1 mmol/L 4.2 3.6 4.5  Chloride 98 - 111 mmol/L 103 105 104  CO2 22 - 32 mmol/L 28 27 21(L)  Calcium 8.9 - 10.3 mg/dL 0.86) 5.7(Q) 4.6(N)  Total Protein 6.5 - 8.1 g/dL 6.3(L) 6.3(L) 6.7  Total Bilirubin 0.3 - 1.2 mg/dL 0.5 0.7 0.6  Alkaline Phos 38 - 126 U/L 64 63 79  AST 15 - 41 U/L 26 27 34  ALT 0 - 44 U/L 18 16 19     MC 1/22 staph epidermidis from left forearm blood culture and left cubital foosa  03/30/19 BC -NG   Impression/Recommendation  Staph epidermidis bacteremia Erythroderma/cellulitis rt knee skin- and dry skin could be the source of staph epidermidis . Being treated as true pathogen because of the cellulitis over the rt knee which is markedly improved  Continue IV vanco until  04/04/19 unless the staph epi susceptibility is reported as  oxacillin sensitive and then it can be switched to cefazolin or PO.   COVID 19 illness- being treated as aspiration pneumonia which has resolved-  dc unasyn  ID will follow peripherally

## 2019-04-01 ENCOUNTER — Inpatient Hospital Stay: Payer: Medicare Other

## 2019-04-01 LAB — CBC
HCT: 42.4 % (ref 39.0–52.0)
Hemoglobin: 13.7 g/dL (ref 13.0–17.0)
MCH: 29.1 pg (ref 26.0–34.0)
MCHC: 32.3 g/dL (ref 30.0–36.0)
MCV: 90 fL (ref 80.0–100.0)
Platelets: 341 10*3/uL (ref 150–400)
RBC: 4.71 MIL/uL (ref 4.22–5.81)
RDW: 13.1 % (ref 11.5–15.5)
WBC: 16.5 10*3/uL — ABNORMAL HIGH (ref 4.0–10.5)
nRBC: 0 % (ref 0.0–0.2)

## 2019-04-01 LAB — GLUCOSE, CAPILLARY
Glucose-Capillary: 211 mg/dL — ABNORMAL HIGH (ref 70–99)
Glucose-Capillary: 242 mg/dL — ABNORMAL HIGH (ref 70–99)
Glucose-Capillary: 338 mg/dL — ABNORMAL HIGH (ref 70–99)
Glucose-Capillary: 388 mg/dL — ABNORMAL HIGH (ref 70–99)

## 2019-04-01 LAB — COMPREHENSIVE METABOLIC PANEL
ALT: 30 U/L (ref 0–44)
AST: 42 U/L — ABNORMAL HIGH (ref 15–41)
Albumin: 2.8 g/dL — ABNORMAL LOW (ref 3.5–5.0)
Alkaline Phosphatase: 76 U/L (ref 38–126)
Anion gap: 8 (ref 5–15)
BUN: 16 mg/dL (ref 8–23)
CO2: 28 mmol/L (ref 22–32)
Calcium: 8.7 mg/dL — ABNORMAL LOW (ref 8.9–10.3)
Chloride: 102 mmol/L (ref 98–111)
Creatinine, Ser: 0.64 mg/dL (ref 0.61–1.24)
GFR calc Af Amer: >60 mL/min (ref >60)
GFR calc non Af Amer: >60 mL/min (ref >60)
Glucose, Bld: 232 mg/dL — ABNORMAL HIGH (ref 70–99)
Potassium: 3.9 mmol/L (ref 3.5–5.1)
Sodium: 138 mmol/L (ref 135–145)
Total Bilirubin: 0.8 mg/dL (ref 0.3–1.2)
Total Protein: 6.9 g/dL (ref 6.5–8.1)

## 2019-04-01 LAB — C-REACTIVE PROTEIN: CRP: 2.6 mg/dL — ABNORMAL HIGH (ref <1.0)

## 2019-04-01 LAB — FIBRIN DERIVATIVES D-DIMER (ARMC ONLY): Fibrin derivatives D-dimer (ARMC): 578.43 ng/mL (FEU) — ABNORMAL HIGH (ref 0.00–499.00)

## 2019-04-01 LAB — VANCOMYCIN, PEAK: Vancomycin Pk: 33 ug/mL (ref 30–40)

## 2019-04-01 NOTE — Consult Note (Signed)
Pharmacy Antibiotic Note  Max [name] is a 86 y.o. male admitted on 03/25/2019 with bacteremia.  Pharmacy has been consulted for vancomycin dosing. Bcx growing staph epidermidis.   Plan: Continue maintenance dose of 1750 mg q24H for a predicted  AUC of 500.  Goal AUC of 400-550.  Scr used 0.8, Css min 10.3.   Today is Day 4 of Vancomycin restart this admission and today's dose will be the 3rd maintenance dose.  Will check trough 1/30 to assess for appropriate level   Per ID, will continue until 04/04/19 unless sensitivities return as oxacillin sensitive and then it can be switched to cefazolin or po antibiotic.   Height: 5\' 11"  (180.3 cm) Weight: 249 lb 1.9 oz (113 kg) IBW/kg (Calculated) : 75.3  Temp (24hrs), Avg:98.1 F (36.7 C), Min:97.7 F (36.5 C), Max:98.7 F (37.1 C)  Recent Labs  Lab  0000 03/25/19 2214 03/26/19 0030 03/26/19 0828 03/27/19 0601 03/28/19 1554 03/29/19 0732 03/30/19 0519 03/31/19 0902  WBC  --  8.3  --    < > 9.5 8.9 10.1 10.2 16.5*  CREATININE   < > 1.08  --   --  0.79 0.77 0.71 0.77 0.64  LATICACIDVEN  --  2.7* 1.6  --   --   --   --   --   --    < > = values in this interval not displayed.    Estimated Creatinine Clearance: 83.2 mL/min (by C-G formula based on SCr of 0.64 mg/dL).    Allergies  Allergen Reactions  . Metformin And Related     Antimicrobials this admission: 1/23 pip/tazo >> 1/24 1/24 Unasyn >> 1/28 1/26 Vancomcyin >>   Dose adjustments this admission: None  Microbiology results: 1/22 BCx: STAPHYLOCOCCUS EPIDERMIDIS (at [facility] for further susceptibility testing) 1/23 UCx: NG final 1/27 BCx NG x 2 days  Thank you for allowing pharmacy to be a part of this patient's care.  2/27, PharmD, BCPS 04/01/2019 1:48 PM

## 2019-04-01 NOTE — Progress Notes (Signed)
Spoke with pt contact Melissa. Updated on pts condition.

## 2019-04-01 NOTE — Progress Notes (Signed)
Nutrition Follow-up  DOCUMENTATION CODES:   Obesity unspecified  INTERVENTION:  Continue Ensure BID Continue Magic Cup BID Continue Vital Cuisine Shake daily Continue MVI daily  NUTRITION DIAGNOSIS:   Increased nutrient needs related to catabolic illness(sepsis secondary to pneumonia due to COVID-19 virus) as evidenced by estimated needs.  Addressing via nutrition supplements  GOAL:   Patient will meet greater than or equal to 90% of their needs  Met   MONITOR:   Diet advancement, Labs, I & O's  REASON FOR ASSESSMENT:   Malnutrition Screening Tool    ASSESSMENT:  RD working remotely.  86 year old male with past medical history of dementia, IDDM, HTN, GERD, h/o TIA, diagnosed with Covid-19 approximately 2 weeks ago presented via EMS with altered mental status. In ED, pt very lethargic, low-grade temperature of 100, Covid PCR positive, CXR showed cardiomegaly with vascular congestion and admitted with sepsis secondary to pneumonia due to COVID-19 virus.  1/26 -ID consulted; Bcx growing staph epidermis, vancomycin added 1/26 - 2D Echo negative for any vegetation 1/27 - repeat cultures so far negative  Per notes, pt awake, but non verbal not oriented to time, place, or person. Oxygenation improving, sats 96% on room air.  Diet advance to dysphagia 3 with assist on 1/25, eating 75-100% of meals at this time.  Patient is provided nutrition supplements to aid with increased estimated needs.  Admit wt 113 kg (248.6 lbs)  Medications reviewed and include: Vit C 500 mg daily, Vit D 2000 units daily, Decadron, Aricept, SS novolog, Levemir 20 units at bedtime, Namenda, MVI, Protonix, B12, Vancomycin  Labs: CBGs [phone] x 24 hrs, WBC 16.5 (H)  Diet Order:   Diet Order            DIET DYS 3 Room service appropriate? Yes with Assist; Fluid consistency: Thin  Diet effective now              EDUCATION NEEDS:   No education needs have been identified at this  time  Skin:  Skin Assessment: Reviewed RN Assessment  Last BM:  1/25  Height:   Ht Readings from Last 1 Encounters:  03/25/19 5' 11"  (1.803 m)    Weight:   Wt Readings from Last 1 Encounters:  03/26/19 113 kg    Ideal Body Weight:  78.2 kg  BMI:  Body mass index is 34.75 kg/m.  Estimated Nutritional Needs:   Kcal:  3887-1959  Protein:  136-156  Fluid:  > 2 L/day   Lajuan Lines, RD, LDN Clinical Nutrition [facility] Telephone [phone] After Hours/Weekend Pager: [phone]

## 2019-04-01 NOTE — Progress Notes (Signed)
[facility] Progress Note  Patient: Max [name]    JIR:[id number]  DOA: 03/25/2019     Date of Service: the patient was seen and examined on 04/01/2019  Chief Complaint  Patient presents with  . Altered Mental Status   Brief hospital course: Severe dementia, insulin-dependent type 2 diabetes mellitus, GERD, history of TIA, hypertension brought by EMS with altered mental status. As per daughter patient was diagnosed with COVID-19 2 weeks prior and had symptoms of diarrhea only. In the ED patient was somnolent with low-grade fever of 100 F, elevated blood pressure 163/93 mmHg, tachycardic in the low 110s, tachypneic and O2 sat of 88% on room air.  Currently further plan is identified IV antibiotics and monitor for improvement.  Assessment and Plan: 1.  Sepsis POA. Due to combination of acute hypoxic respiratory failure with COVID-19 illness as well as staph epidermis cellulitis with bacteremia Aspiration pneumonia Sepsis physiology currently resolved. Oxygenation improving as well. No further IV fluids. Blood cultures grew staph epidermidis. Concern for the source of infection has been cellulitis. Patient also at risk for aspiration given his mental status therefore cover with IV vancomycin. Continue IV vanco until  04/04/19 unless the staph epi susceptibility is reported as oxacillin sensitive and then it can be switched to cefazolin or PO. Infectious disease consulted for further assistance. Repeat cultures ordered on 03/30/2019 so far negative.  Also earlier culture sent to [facility] for further sensitivities.  Echocardiogram negative for any vegetation.  2.  Acute metabolic encephalopathy Delirium in the setting of infection in patient with dementia. No behavioral disturbances. Work-up including CT head, UA, B12 RPR and ammonia unremarkable. Appreciate speech therapy evaluation. On dysphagia 3 diet with chopped meat and gravy.  Thin liquid.  Medication crushed with pure.   3.  Type II DM, long-term insulin use, uncontrolled with hyperglycemia with vascular abnormality A1c of 8.8 Currently on sliding scale insulin. Due to higher glucose level will increase premeal coverage. Continue Lantus for now.  4.  History of TIA Patient does not have focal deficit. CT head unremarkable. Monitor.  5. Medical reconciliation error with Eliquis Patient's PTA med list shows presence of Eliquis. Family is not aware of any anticoagulation. No indication identified on the chart review as well. Based on the last report from patient's neurologist patient is not on any anticoagulation back then as well. Currently off of any anticoagulation on DVT prophylaxis with Lovenox.  6.  Essential hypertension. Blood pressure stable.  Resuming.  7.  Obesity. Outpatient dietary consultation. Body mass index is 34.75 kg/m.   Diet: Dysphagia level 3 w/ Chopped/Minced meats w/ gravies added; Thin liquids. General aspiration precautions. Supervision during meals to assist as needed and monitor safe follow through w/ eating/drinking tasks. DVT Prophylaxis: Subcutaneous Lovenox   Advance goals of care discussion: DNR  Family Communication: no family was present at bedside, at the time of interview.   Disposition:  Pt is from [facility], admitted with confusion and sepsis, still has confusion as well as concern for bacteremia, which precludes a safe discharge. Discharge to ALF, when repeat cultures are negative.  Subjective: not interested in talking, tells me his name, denies any pain but on exam reports pain in his legs, no nausea or vomiting, no acute events.  Physical Exam: General: alert not oriented to time, place, and person.  Appear in mild distress, affect flat in affect Eyes: PERRL ENT: Oral Mucosa Clear, moist  Neck: no JVD,  Cardiovascular: S1 and S2 Present, no Murmur,  Respiratory: good respiratory effort, Bilateral Air entry equal and Decreased, no  Crackles, no wheezes Abdomen: Bowel Sound present, Soft and no tenderness,  Skin: no rash Extremities: bilateral  Pedal edema, no calf tenderness Neurologic: without any new focal findings Gait not checked due to patient safety concerns  Vitals:   04/01/19 0200 04/01/19 0400 04/01/19 0737 04/01/19 1600  BP:  (!) 173/111 (!) 127/99 122/80  Pulse: (!) 59 65 61 68  Resp: 19 20 19 20   Temp:  97.7 F (36.5 C) 97.8 F (36.6 C) 97.6 F (36.4 C)  TempSrc:  Axillary Oral Oral  SpO2: 96% 97% 96% 99%  Weight:      Height:        Intake/Output Summary (Last 24 hours) at 04/01/2019 1737 Last data filed at 04/01/2019 0855 Gross per 24 hour  Intake 1015 ml  Output 500 ml  Net 515 ml   Filed Weights   03/25/19 2200 03/26/19 0309  Weight: 127 kg 113 kg    Data Reviewed: I have personally reviewed and interpreted daily labs, tele strips, imagings as discussed above. I reviewed all nursing notes, pharmacy notes, vitals, pertinent old records I have discussed plan of care as described above with RN and patient/family.  CBC: Recent Labs  Lab 03/25/19 2214 03/26/19 0828 03/27/19 0601 03/28/19 1554 03/29/19 0732 03/30/19 0519 03/31/19 0902  WBC 8.3   < > 9.5 8.9 10.1 10.2 16.5*  NEUTROABS 6.5  --   --   --   --   --   --   HGB 14.1   < > 12.4* 13.7 13.0 13.5 13.7  HCT 44.4   < > 39.0 42.5 40.0 41.5 42.4  MCV 91.2   < > 92.0 90.0 89.7 89.4 90.0  PLT 220   < > 230 286 288 289 341   < > = values in this interval not displayed.   Basic Metabolic Panel: Recent Labs  Lab 03/25/19 2214 03/26/19 0828 03/27/19 0601 03/28/19 1554 03/29/19 0732 03/30/19 0519 03/31/19 0902  NA   < >  --  146* 137 139 140 138  K   < >  --  3.7 4.5 3.6 4.2 3.9  CL   < >  --  110 104 105 103 102  CO2   < >  --  27 21* 27 28 28   GLUCOSE   < >  --  93 388* 127* 113* 232*  BUN   < >  --  18 21 16 15 16   CREATININE   < >  --  0.79 0.77 0.71 0.77 0.64  CALCIUM   < >  --  8.5* 8.5* 8.5* 8.5* 8.7*  MG  --   2.0  --   --   --   --   --    < > = values in this interval not displayed.    Studies: No results found.  Scheduled Meds: . ascorbic acid  500 mg Oral Daily  . brimonidine  1 drop Right Eye BID   And  . timolol  1 drop Right Eye BID  . cholecalciferol  2,000 Units Oral Daily  . cycloSPORINE  1 drop Right Eye BID  . darifenacin  7.5 mg Oral Daily  . dexamethasone (DECADRON) injection  6 mg Intravenous Q24H  . donepezil  10 mg Oral QHS  . DULoxetine  60 mg Oral Daily  . enoxaparin (LOVENOX) injection  40 mg Subcutaneous Q24H  . feeding supplement (ENSURE ENLIVE)  237 mL Oral BID BM  . insulin aspart  0-15 Units Subcutaneous TID WC  . insulin aspart  5 Units Subcutaneous TID WC  . insulin detemir  20 Units Subcutaneous QHS  . latanoprost  1 drop Right Eye QHS  . losartan  100 mg Oral Daily  . memantine  10 mg Oral BID  . mirabegron ER  50 mg Oral Daily  . multivitamin with minerals  1 tablet Oral Daily  . pantoprazole  40 mg Oral Daily  . sertraline  50 mg Oral Daily  . traZODone  75 mg Oral QHS  . vitamin B-12  1,000 mcg Oral Daily   Continuous Infusions: . vancomycin Stopped (03/31/19 2031)   PRN Meds: acetaminophen **OR** acetaminophen, bisacodyl, fluticasone, hydrALAZINE, ondansetron **OR** ondansetron (ZOFRAN) IV, senna-docusate  Time spent: 35 minutes  Author: Berle Mull, MD [facility] 04/01/2019 5:37 PM  To reach On-call, see care teams to locate the attending and reach out to them via [email or web address]. If 7PM-7AM, please contact night-coverage If you still have difficulty reaching the attending provider, please page the [facility] (Director on Call) for [facility] on amion for assistance.

## 2019-04-01 NOTE — Progress Notes (Signed)
Inpatient Diabetes Program Recommendations  AACE/ADA: New Consensus Statement on Inpatient Glycemic Control (2015)  Target Ranges:  Prepandial:   less than 140 mg/dL      Peak postprandial:   less than 180 mg/dL (1-2 hours)      Critically ill patients:  140 - 180 mg/dL   Results for SALAAM, BATTERSHELL (MRN [id number]) as of 04/01/2019 11:59  Ref. Range 03/31/2019 07:35 03/31/2019 11:30 03/31/2019 17:05 03/31/2019 20:12  Glucose-Capillary Latest Ref Range: 70 - 99 mg/dL 93  5 units NOVOLOG given at 9:19am 171 (H)  8 units NOVOLOG  336 (H)  16 units NOVOLOG  364 (H)    20 units LEVEMIR   Results for KEANON, BEVINS (MRN [id number]) as of 04/01/2019 11:59  Ref. Range 04/01/2019 07:40 04/01/2019 11:31  Glucose-Capillary Latest Ref Range: 70 - 99 mg/dL 390 (H)  10 units NOVOLOG  242 (H)     Home DM Meds: Glipizide 20 mg Daily        Lantus 15 units BID       Novolog 2-14 units TID per SSI       Actos 15 mg Daily   Current Orders: Levemir 20 units QHS         Novolog Moderate Correction Scale/ SSI (0-15 units) TID AC      Novolog 5 units TID with meals     Getting Decadron 6 mg Daily.  Having elevated CBGs after meals.  CBG also elevate this AM.    MD- Please consider the following in-hospital insulin adjustments:  Increase Novolog Meal Coverage to: Novolog 8 units TID with meals while patient remains on the IV Decadron     --Will follow patient during hospitalization--  Ambrose Finland RN, MSN, CDE Diabetes Coordinator Inpatient Glycemic Control Team Team Pager: [phone] (8a-5p)

## 2019-04-01 NOTE — Care Management Important Message (Signed)
Important Message  Patient Details  Name: Max [name] MRN: [id number] Date of Birth: [birth year]   Medicare Important Message Given:  Yes Patient in isolation unit, IM given to bedside RN to give to patient.     Allayne Butcher, RN 04/01/2019, 3:43 PM

## 2019-04-02 ENCOUNTER — Inpatient Hospital Stay: Payer: Medicare Other

## 2019-04-02 LAB — CREATININE, SERUM
Creatinine, Ser: 0.59 mg/dL — ABNORMAL LOW (ref 0.61–1.24)
GFR calc Af Amer: >60 mL/min (ref >60)
GFR calc non Af Amer: >60 mL/min (ref >60)

## 2019-04-02 LAB — FIBRIN DERIVATIVES D-DIMER (ARMC ONLY): Fibrin derivatives D-dimer (ARMC): 384.42 ng/mL (FEU) (ref 0.00–499.00)

## 2019-04-02 LAB — VANCOMYCIN, TROUGH: Vancomycin Tr: 12 ug/mL — ABNORMAL LOW (ref 15–20)

## 2019-04-02 LAB — GLUCOSE, CAPILLARY
Glucose-Capillary: 124 mg/dL — ABNORMAL HIGH (ref 70–99)
Glucose-Capillary: 210 mg/dL — ABNORMAL HIGH (ref 70–99)
Glucose-Capillary: 390 mg/dL — ABNORMAL HIGH (ref 70–99)
Glucose-Capillary: 306 mg/dL — ABNORMAL HIGH (ref 70–99)

## 2019-04-02 LAB — C-REACTIVE PROTEIN: CRP: 1.6 mg/dL — ABNORMAL HIGH (ref <1.0)

## 2019-04-02 MED ORDER — INSULIN DETEMIR 100 UNIT/ML ~~LOC~~ SOLN
25.0000 [IU] | Freq: Every day | SUBCUTANEOUS | Status: DC
  Administered 2019-04-02 – 2019-04-03 (×2): 25 [IU] via SUBCUTANEOUS
  Filled 2019-04-02 (×2): qty 1

## 2019-04-02 NOTE — Progress Notes (Signed)
Daughter Max [name]) updated on patient's status.

## 2019-04-02 NOTE — Progress Notes (Signed)
Daughter Efraim Kaufmann updated on pt status.

## 2019-04-02 NOTE — Progress Notes (Signed)
[facility] Progress Note  Patient: Max [name]    TIR:[id number]  DOA: 03/25/2019     Date of Service: the patient was seen and examined on 04/02/2019  Chief Complaint  Patient presents with  . Altered Mental Status   Brief hospital course: Severe dementia, insulin-dependent type 2 diabetes mellitus, GERD, history of TIA, hypertension brought by EMS with altered mental status. As per daughter patient was diagnosed with COVID-19 2 weeks prior and had symptoms of diarrhea only. In the ED patient was somnolent with low-grade fever of 100 F, elevated blood pressure 163/93 mmHg, tachycardic in the low 110s, tachypneic and O2 sat of 88% on room air.  Currently further plan is identified IV antibiotics and monitor for improvement.  Assessment and Plan: 1.  Sepsis POA. Due to combination of acute hypoxic respiratory failure with COVID-19 illness as well as staph epidermis cellulitis with bacteremia Aspiration pneumonia Sepsis physiology currently resolved. Oxygenation improving as well. No further IV fluids. Blood cultures grew staph epidermidis. Concern for the source of infection has been cellulitis. Patient also at risk for aspiration given his mental status therefore cover with IV vancomycin. Continue IV vanco until  04/04/19 unless the staph epi susceptibility is reported as oxacillin sensitive and then it can be switched to cefazolin or PO. Infectious disease consulted for further assistance. Repeat cultures ordered on 03/30/2019 so far negative.  Also earlier culture sent to [facility] for further sensitivities.  Echocardiogram negative for any vegetation.  2.  Acute metabolic encephalopathy Delirium in the setting of infection in patient with dementia. No behavioral disturbances. Work-up including CT head, UA, B12 RPR and ammonia unremarkable. Appreciate speech therapy evaluation. On dysphagia 3 diet with chopped meat and gravy.  Thin liquid.  Medication crushed with pure.   3.  Type II DM, long-term insulin use, uncontrolled with hyperglycemia with vascular abnormality A1c of 8.8 Currently on sliding scale insulin. Due to higher glucose level will increase premeal coverage. Continue Lantus for now.  4.  History of TIA Patient does not have focal deficit. CT head unremarkable. Monitor.  5. Medical reconciliation error with Eliquis Patient's PTA med list shows presence of Eliquis. Family is not aware of any anticoagulation. No indication identified on the chart review as well. Based on the last report from patient's neurologist patient is not on any anticoagulation back then as well. Currently off of any anticoagulation on DVT prophylaxis with Lovenox.  6.  Essential hypertension. Blood pressure stable.  Resuming.  7.  Obesity. Outpatient dietary consultation. Body mass index is 34.75 kg/m.   Diet: Dysphagia level 3 w/ Chopped/Minced meats w/ gravies added; Thin liquids. General aspiration precautions. Supervision during meals to assist as needed and monitor safe follow through w/ eating/drinking tasks.  DVT Prophylaxis: Subcutaneous Lovenox   Advance goals of care discussion: DNR  Family Communication: no family was present at bedside, at the time of interview.   Disposition:  Pt is from [facility], admitted with confusion and sepsis, still has confusion as well as concern for bacteremia, which precludes a safe discharge. Discharge to ALF, when repeat cultures are negative.  Subjective: No acute event.  No acute complaints.  Physical Exam: General: alert not oriented to time, place, and person.  Appear in mild distress, affect flat in affect Eyes: PERRL ENT: Oral Mucosa Clear, moist  Neck: no JVD,  Cardiovascular: S1 and S2 Present, no Murmur,  Respiratory: good respiratory effort, Bilateral Air entry equal and Decreased, no Crackles, no wheezes Abdomen: Bowel Sound present,  Soft and no tenderness,  Skin: no rash Extremities:  bilateral  Pedal edema, no calf tenderness Neurologic: without any new focal findings Gait not checked due to patient safety concerns  Vitals:   04/02/19 0000 04/02/19 0403 04/02/19 0700 04/02/19 1500  BP: (!) 141/72 (!) 128/55 (!) 170/90 (!) 152/65  Pulse: 63 66    Resp: 16 16    Temp: 98.5 F (36.9 C) 97.8 F (36.6 C) 98.3 F (36.8 C) 98.7 F (37.1 C)  TempSrc: Axillary Axillary  Oral  SpO2: 100% 97%    Weight:      Height:        Intake/Output Summary (Last 24 hours) at 04/02/2019 1915 Last data filed at 04/02/2019 1727 Gross per 24 hour  Intake 860 ml  Output 1100 ml  Net -240 ml   Filed Weights   03/25/19 2200 03/26/19 0309  Weight: 127 kg 113 kg    Data Reviewed: I have personally reviewed and interpreted daily labs, tele strips, imagings as discussed above. I reviewed all nursing notes, pharmacy notes, vitals, pertinent old records I have discussed plan of care as described above with RN and patient/family.  CBC: Recent Labs  Lab 03/27/19 0601 03/28/19 1554 03/29/19 0732 03/30/19 0519 03/31/19 0902  WBC 9.5 8.9 10.1 10.2 16.5*  HGB 12.4* 13.7 13.0 13.5 13.7  HCT 39.0 42.5 40.0 41.5 42.4  MCV 92.0 90.0 89.7 89.4 90.0  PLT 230 286 288 289 341   Basic Metabolic Panel: Recent Labs  Lab 03/27/19 0601 03/27/19 0601 03/28/19 1554 03/29/19 0732 03/30/19 0519 03/31/19 0902 04/02/19 0432  NA 146*  --  137 139 140 138  --   K 3.7  --  4.5 3.6 4.2 3.9  --   CL 110  --  104 105 103 102  --   CO2 27  --  21* 27 28 28   --   GLUCOSE 93  --  388* 127* 113* 232*  --   BUN 18  --  21 16 15 16   --   CREATININE 0.79   < > 0.77 0.71 0.77 0.64 0.59*  CALCIUM 8.5*  --  8.5* 8.5* 8.5* 8.7*  --    < > = values in this interval not displayed.    Studies: No results found.  Scheduled Meds: . ascorbic acid  500 mg Oral Daily  . brimonidine  1 drop Right Eye BID   And  . timolol  1 drop Right Eye BID  . cholecalciferol  2,000 Units Oral Daily  . cycloSPORINE  1  drop Right Eye BID  . darifenacin  7.5 mg Oral Daily  . dexamethasone (DECADRON) injection  6 mg Intravenous Q24H  . donepezil  10 mg Oral QHS  . DULoxetine  60 mg Oral Daily  . enoxaparin (LOVENOX) injection  40 mg Subcutaneous Q24H  . feeding supplement (ENSURE ENLIVE)  237 mL Oral BID BM  . insulin aspart  0-15 Units Subcutaneous TID WC  . insulin aspart  5 Units Subcutaneous TID WC  . insulin detemir  25 Units Subcutaneous QHS  . latanoprost  1 drop Right Eye QHS  . losartan  100 mg Oral Daily  . memantine  10 mg Oral BID  . mirabegron ER  50 mg Oral Daily  . multivitamin with minerals  1 tablet Oral Daily  . pantoprazole  40 mg Oral Daily  . sertraline  50 mg Oral Daily  . traZODone  75 mg Oral QHS  . vitamin  B-12  1,000 mcg Oral Daily   Continuous Infusions: . vancomycin Stopped (04/01/19 2012)   PRN Meds: acetaminophen **OR** acetaminophen, bisacodyl, fluticasone, hydrALAZINE, ondansetron **OR** ondansetron (ZOFRAN) IV, senna-docusate  Time spent: 35 minutes  Author: Berle Mull, MD [facility] 04/02/2019 7:15 PM  To reach On-call, see care teams to locate the attending and reach out to them via [email or web address]. If 7PM-7AM, please contact night-coverage If you still have difficulty reaching the attending provider, please page the [facility] (Director on Call) for [facility] on amion for assistance.

## 2019-04-02 NOTE — Consult Note (Signed)
Pharmacy Antibiotic Note  Max [name] is a 86 y.o. male admitted on 03/25/2019 with bacteremia.  Pharmacy has been consulted for vancomycin dosing. Bcx growing staph epidermidis.   Plan: Today is Day 5 of Vancomycin restart this admission and today's dose will be the 4th maintenance dose.  Vancomcyin Peak: 33 ug/mL Vancomycin Trough: 12 ug/mL  Using the 2-level PK calculator for Vancomycin dosing, we will continue the same dose:  Vancomycin 1750mg  Q 24 hours Expected AUC: 520.2 Expected Peak: 35.1 Expected Trough: 12.1  Per ID, will continue until 04/04/19 unless sensitivities return as oxacillin sensitive and then it can be switched to cefazolin or po antibiotic.   Height: 5\' 11"  (180.3 cm) Weight: 249 lb 1.9 oz (113 kg) IBW/kg (Calculated) : 75.3  Temp (24hrs), Avg:98.4 F (36.9 C), Min:97.8 F (36.6 C), Max:98.8 F (37.1 C)  Recent Labs  Lab 03/27/19 0601 03/27/19 0601 03/28/19 1554 03/29/19 0732 03/30/19 0519 03/31/19 0902 04/01/19 2126 04/02/19 0432 04/02/19 1807  WBC 9.5  --  8.9 10.1 10.2 16.5*  --   --   --   CREATININE 0.79   < > 0.77 0.71 0.77 0.64  --  0.59*  --   VANCOTROUGH  --   --   --   --   --   --   --   --  12*  VANCOPEAK  --   --   --   --   --   --  33  --   --    < > = values in this interval not displayed.    Estimated Creatinine Clearance: 83.2 mL/min (A) (by C-G formula based on SCr of 0.59 mg/dL (L)).    Allergies  Allergen Reactions  . Metformin And Related     Antimicrobials this admission: 1/23 pip/tazo >> 1/24 1/24 Unasyn >> 1/28 1/26 Vancomcyin >>   Dose adjustments this admission: None  Microbiology results: 1/22 BCx: STAPHYLOCOCCUS EPIDERMIDIS (at [facility] for further susceptibility testing) 1/23 UCx: NG final 1/27 BCx NG x 2 days  Thank you for allowing pharmacy to be a part of this patient's care.  2/23, PharmD 04/02/2019 8:17 PM

## 2019-04-03 LAB — BASIC METABOLIC PANEL
Anion gap: 12 (ref 5–15)
BUN: 18 mg/dL (ref 8–23)
CO2: 26 mmol/L (ref 22–32)
Calcium: 8.9 mg/dL (ref 8.9–10.3)
Chloride: 96 mmol/L — ABNORMAL LOW (ref 98–111)
Creatinine, Ser: 0.72 mg/dL (ref 0.61–1.24)
GFR calc Af Amer: >60 mL/min (ref >60)
GFR calc non Af Amer: >60 mL/min (ref >60)
Glucose, Bld: 161 mg/dL — ABNORMAL HIGH (ref 70–99)
Potassium: 4.2 mmol/L (ref 3.5–5.1)
Sodium: 134 mmol/L — ABNORMAL LOW (ref 135–145)

## 2019-04-03 LAB — GLUCOSE, CAPILLARY
Glucose-Capillary: 94 mg/dL (ref 70–99)
Glucose-Capillary: 210 mg/dL — ABNORMAL HIGH (ref 70–99)
Glucose-Capillary: 254 mg/dL — ABNORMAL HIGH (ref 70–99)
Glucose-Capillary: 298 mg/dL — ABNORMAL HIGH (ref 70–99)

## 2019-04-03 LAB — FIBRIN DERIVATIVES D-DIMER (ARMC ONLY): Fibrin derivatives D-dimer (ARMC): 355.12 ng/mL (FEU) (ref 0.00–499.00)

## 2019-04-03 LAB — C-REACTIVE PROTEIN: CRP: 0.9 mg/dL (ref <1.0)

## 2019-04-03 NOTE — Progress Notes (Signed)
Daughter Melissa update on patient's status.

## 2019-04-03 NOTE — Progress Notes (Signed)
[facility] Progress Note  Patient: Max [name]    MWU:[id number]  DOA: 03/25/2019     Date of Service: the patient was seen and examined on 04/03/2019  Chief Complaint  Patient presents with  . Altered Mental Status   Brief hospital course: Severe dementia, insulin-dependent type 2 diabetes mellitus, GERD, history of TIA, hypertension brought by EMS with altered mental status. As per daughter patient was diagnosed with COVID-19 2 weeks prior and had symptoms of diarrhea only. In the ED patient was somnolent with low-grade fever of 100 F, elevated blood pressure 163/93 mmHg, tachycardic in the low 110s, tachypneic and O2 sat of 88% on room air.  Currently further plan is  continue IV antibiotics and arrange for transfer to ALF  Assessment and Plan: 1.  Sepsis POA. Due to combination of acute hypoxic respiratory failure with COVID-19 illness as well as staph epidermis cellulitis with bacteremia Aspiration pneumonia Sepsis physiology currently resolved. Oxygenation improving as well. No further IV fluids. Blood cultures grew staph epidermidis. Concern for the source of infection has been cellulitis. Patient also at risk for aspiration given his mental status therefore cover with IV vancomycin. Continue IV vanco until  04/04/19 unless the staph epi susceptibility is reported as oxacillin sensitive and then it can be switched to cefazolin or PO. Infectious disease consulted for further assistance. Repeat cultures ordered on 03/30/2019 so far negative.  Also earlier culture sent to [facility] for further sensitivities.  Echocardiogram negative for any vegetation.  2.  Acute metabolic encephalopathy Delirium in the setting of infection in patient with dementia. No behavioral disturbances. Work-up including CT head, UA, B12 RPR and ammonia unremarkable. Appreciate speech therapy evaluation. On dysphagia 3 diet with chopped meat and gravy.  Thin liquid.  Medication crushed with pure.   3.  Type II DM, long-term insulin use, uncontrolled with hyperglycemia with vascular abnormality A1c of 8.8 Currently on sliding scale insulin. Due to higher glucose level will increase premeal coverage. Continue Lantus for now.  4.  History of TIA Patient does not have focal deficit. CT head unremarkable. Monitor.  5. Medical reconciliation error with Eliquis Patient's PTA med list shows presence of Eliquis. Family is not aware of any anticoagulation. No indication identified on the chart review as well. Based on the last report from patient's neurologist patient is not on any anticoagulation back then as well. Currently off of any anticoagulation on DVT prophylaxis with Lovenox.  6.  Essential hypertension. Blood pressure stable.  Resuming.  7.  Obesity. Outpatient dietary consultation. Body mass index is 34.75 kg/m.   Diet: Dysphagia level 3 w/ Chopped/Minced meats w/ gravies added; Thin liquids. General aspiration precautions. Supervision during meals to assist as needed and monitor safe follow through w/ eating/drinking tasks.  DVT Prophylaxis: Subcutaneous Lovenox   Advance goals of care discussion: DNR  Family Communication: no family was present at bedside, at the time of interview.   Disposition:  Pt is from [facility], admitted with confusion and sepsis, still has confusion as well as concern for bacteremia, which precludes a safe discharge. Discharge to ALF, when repeat cultures are negative likely on 04/04/2019  Subjective: No acute complaints no acute events.  Physical Exam: General: alert not oriented to time, place, and person.  Appear in mild distress, affect flat in affect Eyes: PERRL ENT: Oral Mucosa Clear, moist  Neck: no JVD,  Cardiovascular: S1 and S2 Present, no Murmur,  Respiratory: good respiratory effort, Bilateral Air entry equal and Decreased, no Crackles, no  wheezes Abdomen: Bowel Sound present, Soft and no tenderness,  Skin: no  rash Extremities: bilateral  Pedal edema, no calf tenderness Neurologic: without any new focal findings Gait not checked due to patient safety concerns  Vitals:   04/03/19 0100 04/03/19 0156 04/03/19 0400 04/03/19 0705  BP: (!) 162/69 (!) 162/69 (!) 190/68 (!) 126/38  Pulse: 71 80 65   Resp: 18 18 17    Temp: 99 F (37.2 C)  97.9 F (36.6 C) 98.3 F (36.8 C)  TempSrc: Axillary  Axillary   SpO2: 96% 95% 96% 100%  Weight:      Height:        Intake/Output Summary (Last 24 hours) at 04/03/2019 1938 Last data filed at 04/03/2019 0800 Gross per 24 hour  Intake 550 ml  Output 1500 ml  Net -950 ml   Filed Weights   03/25/19 2200 03/26/19 0309  Weight: 127 kg 113 kg    Data Reviewed: I have personally reviewed and interpreted daily labs, tele strips, imagings as discussed above. I reviewed all nursing notes, pharmacy notes, vitals, pertinent old records I have discussed plan of care as described above with RN and patient/family.  CBC: Recent Labs  Lab 03/28/19 1554 03/29/19 0732 03/30/19 0519 03/31/19 0902  WBC 8.9 10.1 10.2 16.5*  HGB 13.7 13.0 13.5 13.7  HCT 42.5 40.0 41.5 42.4  MCV 90.0 89.7 89.4 90.0  PLT 286 288 289 510   Basic Metabolic Panel: Recent Labs  Lab 03/28/19 1554 03/28/19 1554 03/29/19 0732 03/30/19 0519 03/31/19 0902 04/02/19 0432 04/03/19 0912  NA 137  --  139 140 138  --  134*  K 4.5  --  3.6 4.2 3.9  --  4.2  CL 104  --  105 103 102  --  96*  CO2 21*  --  27 28 28   --  26  GLUCOSE 388*  --  127* 113* 232*  --  161*  BUN 21  --  16 15 16   --  18  CREATININE 0.77   < > 0.71 0.77 0.64 0.59* 0.72  CALCIUM 8.5*  --  8.5* 8.5* 8.7*  --  8.9   < > = values in this interval not displayed.    Studies: No results found.  Scheduled Meds: . ascorbic acid  500 mg Oral Daily  . brimonidine  1 drop Right Eye BID   And  . timolol  1 drop Right Eye BID  . cholecalciferol  2,000 Units Oral Daily  . cycloSPORINE  1 drop Right Eye BID  .  darifenacin  7.5 mg Oral Daily  . dexamethasone (DECADRON) injection  6 mg Intravenous Q24H  . donepezil  10 mg Oral QHS  . DULoxetine  60 mg Oral Daily  . enoxaparin (LOVENOX) injection  40 mg Subcutaneous Q24H  . feeding supplement (ENSURE ENLIVE)  237 mL Oral BID BM  . insulin aspart  0-15 Units Subcutaneous TID WC  . insulin aspart  5 Units Subcutaneous TID WC  . insulin detemir  25 Units Subcutaneous QHS  . latanoprost  1 drop Right Eye QHS  . losartan  100 mg Oral Daily  . memantine  10 mg Oral BID  . mirabegron ER  50 mg Oral Daily  . multivitamin with minerals  1 tablet Oral Daily  . pantoprazole  40 mg Oral Daily  . sertraline  50 mg Oral Daily  . traZODone  75 mg Oral QHS  . vitamin B-12  1,000 mcg Oral  Daily   Continuous Infusions: . vancomycin Stopped (04/02/19 2300)   PRN Meds: acetaminophen **OR** acetaminophen, bisacodyl, fluticasone, hydrALAZINE, ondansetron **OR** ondansetron (ZOFRAN) IV, senna-docusate  Time spent: 35 minutes  Author: Lynden Oxford, MD [facility] 04/03/2019 7:38 PM  To reach On-call, see care teams to locate the attending and reach out to them via [email or web address]. If 7PM-7AM, please contact night-coverage If you still have difficulty reaching the attending provider, please page the [facility] (Director on Call) for [facility] on amion for assistance.

## 2019-04-04 LAB — FIBRIN DERIVATIVES D-DIMER (ARMC ONLY): Fibrin derivatives D-dimer (ARMC): 310.3 ng/mL (FEU) (ref 0.00–499.00)

## 2019-04-04 LAB — CULTURE, BLOOD (ROUTINE X 2)
Culture: NO GROWTH
Culture: NO GROWTH
Special Requests: ADEQUATE
Special Requests: ADEQUATE

## 2019-04-04 LAB — GLUCOSE, CAPILLARY
Glucose-Capillary: 331 mg/dL — ABNORMAL HIGH (ref 70–99)
Glucose-Capillary: 287 mg/dL — ABNORMAL HIGH (ref 70–99)

## 2019-04-04 LAB — C-REACTIVE PROTEIN: CRP: 0.7 mg/dL (ref <1.0)

## 2019-04-04 MED ORDER — ENSURE ENLIVE PO LIQD: 237.0000 mL | Freq: Two times a day (BID) | ORAL | 12 refills | Status: AC

## 2019-04-04 NOTE — Discharge Summary (Addendum)
[facility] Discharge Summary   Patient: Max KalataRaymond Kashani WUJ:[id number]  PCP: [facility]  Date of admission: 03/25/2019   Date of discharge:  04/04/2019     Discharge Diagnoses:  Principal Problem:   Sepsis (HCC) Active Problems:   Pneumonia due to COVID-19 virus   History of TIA (transient ischemic attack)   Chronic anticoagulation   HTN (hypertension)   Dementia (HCC)   Type 2 diabetes mellitus without complication, with long-term current use of insulin (HCC)   Acute metabolic encephalopathy   Acute respiratory failure with hypoxia (HCC)  Admitted From: ALF Disposition:  ALF/ILF with home health  Recommendations for Outpatient Follow-up:  1. PCP: please follow up in 1 week 2. Follow up LABS/TEST:  none  Follow-up Information    [facility]. Schedule an appointment as soon as possible for a visit in 1 week(s).   Specialty: Geriatric Medicine Contact information: [address] [place] [id number] [phone]          Diet recommendation: Dysphagia level 3 w/ Chopped/Minced meats, gravies added; Thin liquids. Aspiration precautions; tray setup and monitoring at meals. Pills CRUSHED in Puree d/t Cognitive decline  Activity: The patient is advised to gradually reintroduce usual activities, as tolerated  Discharge Condition: stable  Code Status: DNR   History of present illness: As per the H and P dictated on admission, "Max KalataRaymond Deriso is a 86 y.o. male with medical history significant for dementia, insulin requiring type 2 diabetes, hypertension, GERD with history of TIA who was brought into the emergency room by EMS with altered mental status.  His last known normal was "a few hours' prior to arrival.  History is limited due to altered mental status.  Spoke with patient's daughter over the telephone but she said she had not seen him in quite a while because of Covid but did report that he was having some stomach  discomfort.  Also reports that patient was diagnosed with Covid 2 weeks prior, but was mostly asymptomatic except for an episode of diarrhea 3 days after his diagnosis and she states he was cleared and taken out of the Covid isolation unit the day prior to arrival to the ER.  ED Course: On arrival in the emergency room patient was very lethargic.  He had a low-grade temperature of 100, blood pressure of 163/93, tachycardic at 113 and tachypneic at 32 with O2 sat 88% on room air.  His white cell count was 8000.  Lactic acid normal at 1.6.  Covid PCR was positive, influenza negative, procalcitonin pending.  Troponin was mildly elevated at 50.  His other labs are for the most part unremarkable.  Chest x-ray showed IMPRESSION: 1. Cardiomegaly with vascular congestion. 2. Bilateral lower lung field subpleural atelectasis versus developing infiltrate. Clinical correlation is recommended Patient was deemed to be septic and started on an IV fluid bolus along with cefepime metronidazole and vancomycin.  Hospitalist consulted for admission"  Hospital Course:  Summary of his active problems in the hospital is as following.   1.Sepsis POA. Due to combination of acute hypoxic respiratory failure with COVID-19 illness as well as staph epidermis cellulitis with bacteremia Aspiration pneumonia Sepsis physiology currently resolved. Oxygenation improving as well. No further IV fluids. Blood cultures grew staph epidermidis. Concern for the source of infection has been cellulitis. Patient also at risk for aspiration given his mental status therefore cover with IV vancomycin. Continue IV vanco until 04/04/19. Infectious disease consulted for further assistance. Repeat cultures ordered  on 03/30/2019 so far negative.  Also earlier culture sent to [facility] for further sensitivities.  Echocardiogram negative for any vegetation.  2.Acute metabolic encephalopathy Delirium in the setting of infection in patient  with dementia. No behavioral disturbances. Work-up including CT head, UA, B12 RPR and ammonia unremarkable. Appreciate speech therapy evaluation. On dysphagia 3 diet with chopped meat and gravy. Thin liquid. Medication crushed with pure.  3.Type II DM, long-term insulin use, uncontrolled with hyperglycemia with vascular abnormality A1c of 8.8 Continue home regimen  4.History of TIA Patient does not have focal deficit. CT head unremarkable. Monitor.  5.Medical reconciliationerrorwith Eliquis Patient's PTA med list shows presence of Eliquis. Family is not aware of any anticoagulation. No indication identified on the chart review as well. Based on the last report from patient's neurologist patient is not on any anticoagulation back then as well. Currently off of any anticoagulation on DVT prophylaxis with Lovenox.  6.Essential hypertension. Blood pressure stable. Resuming.  7.Obesity. Outpatient dietary consultation.  Body mass index is 34.75 kg/m.   Patient was seen by physical therapy, who recommended no therapy needed on discharge, return to memory care unit was arranged. On the day of the discharge the patient's vitals were stable, and no other acute medical condition were reported by patient. the patient was felt safe to be discharge at ALF/ILF with no therapy needed on discharge.  Consultants: ID Procedures: Echocardiogram   Discharge Exam: General: Appear in no distress, dry skin, no Rash; Oral Mucosa Clear, moist. Cardiovascular: S1 and S2 Present, no Murmur, Respiratory: normal respiratory effort, Bilateral Air entry present and faint basal Crackles, no wheezes Abdomen: Bowel Sound present, Soft and no tenderness, no hernia Extremities: trace Pedal edema, no calf tenderness Neurology: alert and not oriented to time, place, and person affect appropriate.  Filed Weights   03/25/19 2200 03/26/19 0309  Weight: 127 kg 113 kg   Vitals:   04/04/19  0031 04/04/19 0705  BP: (!) 166/70 (!) 152/63  Pulse: 75 75  Resp: 16 (!) 21  Temp: 98.2 F (36.8 C) 98.6 F (37 C)  SpO2: 95% 95%    DISCHARGE MEDICATION: Allergies as of 04/04/2019      Reactions   Metformin And Related       Medication List    STOP taking these medications   Eliquis 2.5 MG Tabs tablet Generic drug: apixaban   guaiFENesin 600 MG 12 hr tablet Commonly known as: MUCINEX   Melatonin 3 MG Tabs     TAKE these medications   acetaminophen 325 MG tablet Commonly known as: TYLENOL Take 650 mg by mouth every 4 (four) hours as needed.   ascorbic acid 500 MG tablet Commonly known as: VITAMIN C Take 500 mg by mouth daily.   Baqsimi One Pack 3 MG/DOSE Powd Generic drug: Glucagon Place 1 spray into the nose as needed. When sugar is <55   bisacodyl 5 MG EC tablet Commonly known as: DULCOLAX Take 10 mg by mouth daily as needed.   Cholecalciferol 25 MCG (1000 UT) tablet Take 2,000 Units by mouth daily.   Combigan 0.2-0.5 % ophthalmic solution Generic drug: brimonidine-timolol Place 1 drop into the right eye 2 (two) times daily.   cycloSPORINE 0.05 % ophthalmic emulsion Commonly known as: RESTASIS Place 1 drop into the right eye 2 (two) times daily.   donepezil 10 MG tablet Commonly known as: ARICEPT Take 10 mg by mouth at bedtime.   DULoxetine 60 MG capsule Commonly known as: CYMBALTA Take 60 mg by  mouth daily.   feeding supplement (ENSURE ENLIVE) Liqd Take 237 mLs by mouth 2 (two) times daily between meals.   fluticasone 50 MCG/ACT nasal spray Commonly known as: FLONASE Place 2 sprays into both nostrils daily as needed.   glipiZIDE 10 MG 24 hr tablet Commonly known as: GLUCOTROL XL Take 20 mg by mouth daily with breakfast.   Lantus 100 UNIT/ML injection Generic drug: insulin glargine Inject 15 Units into the skin 2 (two) times daily.   latanoprost 0.005 % ophthalmic solution Commonly known as: XALATAN Place 1 drop into the right eye at  bedtime.   loperamide 2 MG capsule Commonly known as: IMODIUM Take 2 mg by mouth every 6 (six) hours as needed for diarrhea or loose stools.   losartan 100 MG tablet Commonly known as: COZAAR Take 100 mg by mouth daily.   memantine 10 MG tablet Commonly known as: NAMENDA Take 10 mg by mouth 2 (two) times daily.   mirtazapine 7.5 MG tablet Commonly known as: REMERON Take 7.5 mg by mouth at bedtime.   multivitamin with minerals Tabs tablet Take 1 tablet by mouth daily.   Myrbetriq 50 MG Tb24 tablet Generic drug: mirabegron ER Take 50 mg by mouth daily.   NovoLOG FlexPen 100 UNIT/ML FlexPen Generic drug: insulin aspart Inject 2-14 Units into the skin See admin instructions. Sliding Scale Insulin: If 0 - 150 = 0; 151 - 200 = 2u;  201 - 250 = 4u; 251 - 300 = 6u; 301 - 350 = 8 u; 351 - 400 = 10u; 401 - 450 = 12u; 451 - 500 = 14u subcutaneous before meals for DM>500 call [facility]   nystatin cream Commonly known as: MYCOSTATIN Apply 1 application topically See admin instructions. Apply topically to groin every 12 hours as needed for rash.   nystatin powder Generic drug: nystatin Apply 1 application topically See admin instructions. Apply to abdominal folds topically one time a day for redness until rash healed.   omeprazole 20 MG capsule Commonly known as: PRILOSEC Take 20 mg by mouth daily.   pioglitazone 15 MG tablet Commonly known as: ACTOS Take 15 mg by mouth daily.   ranitidine 150 MG tablet Commonly known as: ZANTAC Take 150 mg by mouth daily.   sertraline 50 MG tablet Commonly known as: ZOLOFT Take 50 mg by mouth daily.   solifenacin 10 MG tablet Commonly known as: VESICARE Take 10 mg by mouth daily.   traZODone 150 MG tablet Commonly known as: DESYREL Take 75 mg by mouth at bedtime.   vitamin B-12 1000 MCG tablet Commonly known as: CYANOCOBALAMIN Take 1,000 mcg by mouth daily.   Zinc 50 MG Tabs Take by mouth.      Allergies  Allergen Reactions  .  Metformin And Related    Discharge Instructions    DIET DYS 3   Complete by: As directed    Dysphagia level 3 w/ Chopped/Minced meats, gravies added; Thin liquids. Aspiration precautions; tray setup and monitoring at meals. Pills CRUSHED in Puree d/t Cognitive decline   Fluid consistency: Thin   Increase activity slowly   Complete by: As directed       The results of significant diagnostics from this hospitalization (including imaging, microbiology, ancillary and laboratory) are listed below for reference.    Significant Diagnostic Studies: CT HEAD WO CONTRAST  Result Date: 03/26/2019 CLINICAL DATA:  Mental status changes and fever. EXAM: CT HEAD WITHOUT CONTRAST TECHNIQUE: Contiguous axial images were obtained from the base of the skull  through the vertex without intravenous contrast. COMPARISON:  02/19/2018 FINDINGS: Brain: There is no evidence for acute hemorrhage, hydrocephalus, mass lesion, or abnormal extra-axial fluid collection. No definite CT evidence for acute infarction. Diffuse loss of parenchymal volume is consistent with atrophy. Patchy low attenuation in the deep hemispheric and periventricular white matter is nonspecific, but likely reflects chronic microvascular ischemic demyelination. Stable prominence of the lateral ventricles, presumably from central atrophy. Vascular: No hyperdense vessel or unexpected calcification. Skull: Normal. Negative for fracture or focal lesion. No evidence for fracture. No worrisome lytic or sclerotic lesion. Sinuses/Orbits: The visualized paranasal sinuses and mastoid air cells are clear. Visualized portions of the globes and intraorbital fat are unremarkable. Other: None. IMPRESSION: 1. No acute intracranial abnormality. 2. Atrophy with chronic small vessel white matter ischemic disease. Electronically Signed   By: Kennith Center M.D.   On: 03/26/2019 16:54   DG Chest Port 1 View  Result Date: 03/25/2019 CLINICAL DATA:  86 year old male with  sepsis. EXAM: PORTABLE CHEST 1 VIEW COMPARISON:  Chest radiograph dated 02/19/2018 FINDINGS: There is shallow inspiration. Cardiomegaly with vascular congestion. Bilateral lower lung field peripheral and subpleural hazy densities may represent atelectasis although developing infiltrate not excluded. Clinical correlation is recommended. No large pleural effusion or pneumothorax. No acute osseous pathology. IMPRESSION: 1. Cardiomegaly with vascular congestion. 2. Bilateral lower lung field subpleural atelectasis versus developing infiltrate. Clinical correlation is recommended. Electronically Signed   By: Elgie Collard M.D.   On: 03/25/2019 22:56   ECHOCARDIOGRAM COMPLETE  Result Date: 03/29/2019   ECHOCARDIOGRAM REPORT   Patient Name:   PARLEY PIDCOCK Date of Exam: 03/29/2019 Medical Rec #:  [id number]       Height:       71.0 in Accession #:    [id number]      Weight:       249.1 lb Date of Birth:  01-Jun-1929        BSA:          2.31 m Patient Age:    86 years        BP:           158/96 mmHg Patient Gender: M               HR:           91 bpm. Exam Location:  [facility] Procedure: 2D Echo, Cardiac Doppler and Color Doppler Indications:     Bacteremia 790.7  History:         Patient has no prior history of Echocardiogram examinations.                  Risk Factors:Hypertension and Diabetes.  Sonographer:     Cristela Blue RDCS (AE) Referring Phys:  [id number] Diagnosing Phys: Cristal Deer End MD  Sonographer Comments: Technically challenging study due to limited acoustic windows, no apical window and no subcostal window. Image acquisition challenging due to uncooperative patient and Image acquisition challenging due to patient behavioral factors. IMPRESSIONS  1. Left ventricular ejection fraction, by visual estimation, is 60 to 65%. The left ventricle has normal function. There is moderately increased left ventricular hypertrophy.  2. Global right ventricle was not well visualized.The right ventricular size  is not well visualized. No increase in right ventricular wall thickness.  3. Left atrial size was not well visualized.  4. Right atrial size was not well visualized.  5. The pericardium was not well visualized.  6. The mitral valve was not well visualized. Unable to  accurately assess mitral valve regurgitation.  7. The tricuspid valve is not well visualized.  8. The tricuspid valve is not well visualized. Tricuspid valve regurgitation was not well assessed.  9. Aortic valve regurgitation was not assessed. 10. The aortic valve was not well visualized. Aortic valve regurgitation was not assessed. Aortic stenosis was not assessed. 11. The pulmonic valve was not well visualized. Pulmonic valve regurgitation is not visualized. 12. TR signal is inadequate for assessing pulmonary artery systolic pressure. 13. The interatrial septum was not well visualized. FINDINGS  Left Ventricle: Left ventricular ejection fraction, by visual estimation, is 60 to 65%. The left ventricle has normal function. The left ventricle is not well visualized. The left ventricular internal cavity size was the left ventricle is normal in size. There is moderately increased left ventricular hypertrophy. Right Ventricle: The right ventricular size is not well visualized. No increase in right ventricular wall thickness. Global RV systolic function is was not well visualized. Left Atrium: Left atrial size was not well visualized. Right Atrium: Right atrial size was not well visualized Pericardium: The pericardium was not well visualized. Mitral Valve: The mitral valve was not well visualized. Unable to accurately assess mitral valve regurgitation. Tricuspid Valve: The tricuspid valve is not well visualized. Tricuspid valve regurgitation was not well assessed. Aortic Valve: The aortic valve was not well visualized. . There is moderate thickening of the aortic valve. Aortic valve regurgitation was not assessed. Aortic stenosis was not assessed. There is  moderate thickening of the aortic valve. Pulmonic Valve: The pulmonic valve was not well visualized. Pulmonic valve regurgitation is not visualized. Pulmonic regurgitation is not visualized. No evidence of pulmonic stenosis. Aorta: The aortic root is normal in size and structure. Pulmonary Artery: The pulmonary artery is not well seen. Venous: The inferior vena cava was not well visualized. IAS/Shunts: The interatrial septum was not well visualized.  LEFT VENTRICLE PLAX 2D LVIDd:         5.20 cm LVIDs:         3.17 cm LV PW:         1.60 cm LV IVS:        1.45 cm LVOT diam:     2.40 cm LV SV:         89 ml LV SV Index:   37.01 LVOT Area:     4.52 cm  LEFT ATRIUM         Index LA diam:    3.20 cm 1.38 cm/m                        PULMONIC VALVE AORTA                 RVOT Peak grad: 2 mmHg Ao Root diam: 3.40 cm   SHUNTS Systemic Diam: 2.40 cm  Yvonne Kendall MD Electronically signed by Yvonne Kendall MD Signature Date/Time: 03/29/2019/3:40:19 PM    Final     Microbiology: Recent Results (from the past 240 hour(s))  Blood Culture (routine x 2)     Status: Abnormal (Preliminary result)   Collection Time: 03/25/19 10:14 PM   Specimen: BLOOD  Result Value Ref Range Status   Specimen Description   Final    BLOOD LEFT ANTECUBITAL Performed at [facility], [address] [place], [place] [address]    Special Requests   Final    BOTTLES DRAWN AEROBIC AND ANAEROBIC Blood Culture adequate volume Performed at [facility], [address]  [place], [place] [address]    Culture  Setup Time   Final    GRAM POSITIVE COCCI IN BOTH AEROBIC AND ANAEROBIC BOTTLES CRITICAL RESULT CALLED TO, READ BACK BY AND VERIFIED WITH: SCOTT HALL AT 6045 03/27/19 SDR    Culture (A)  Final    STAPHYLOCOCCUS EPIDERMIDIS Sent to [facility] for further susceptibility testing. Performed at [facility], [address], [place], [place] [address]    Report Status PENDING  Incomplete  Respiratory Panel  by RT PCR (Flu A&B, Covid) - Nasopharyngeal Swab     Status: Abnormal   Collection Time: 03/25/19 10:14 PM   Specimen: Nasopharyngeal Swab  Result Value Ref Range Status   SARS Coronavirus 2 by RT PCR POSITIVE (A) NEGATIVE Final    Comment: RESULT CALLED TO, READ BACK BY AND VERIFIED WITH: sarah mclendon at 0010 on 03/26/19 rww (NOTE) SARS-CoV-2 target nucleic acids are DETECTED. SARS-CoV-2 RNA is generally detectable in upper respiratory specimens  during the acute phase of infection. Positive results are indicative of the presence of the identified virus, but do not rule out bacterial infection or co-infection with other pathogens not detected by the test. Clinical correlation with patient history and other diagnostic information is necessary to determine patient infection status. The expected result is Negative. Fact Sheet for Patients:  https://www.moore.com/ Fact Sheet for Healthcare Providers: https://www.young.biz/ This test is not yet approved or cleared by the [place] FDA and  has been authorized for detection and/or diagnosis of SARS-CoV-2 by FDA under an Emergency Use Authorization (EUA).  This EUA will remain in effect (meaning this test can be used)  for the duration of  the COVID-19 declaration under Section 564(b)(1) of the Act, 21 U.S.C. section 360bbb-3(b)(1), unless the authorization is terminated or revoked sooner.    Influenza A by PCR NEGATIVE NEGATIVE Final   Influenza B by PCR NEGATIVE NEGATIVE Final    Comment: (NOTE) The Xpert Xpress SARS-CoV-2/FLU/RSV assay is intended as an aid in  the diagnosis of influenza from Nasopharyngeal swab specimens and  should not be used as a sole basis for treatment. Nasal washings and  aspirates are unacceptable for Xpert Xpress SARS-CoV-2/FLU/RSV  testing. Fact Sheet for Patients: https://www.moore.com/ Fact Sheet for Healthcare  Providers: https://www.young.biz/ This test is not yet approved or cleared by the [place] FDA and  has been authorized for detection and/or diagnosis of SARS-CoV-2 by  FDA under an Emergency Use Authorization (EUA). This EUA will remain  in effect (meaning this test can be used) for the duration of the  Covid-19 declaration under Section 564(b)(1) of the Act, 21  U.S.C. section 360bbb-3(b)(1), unless the authorization is  terminated or revoked. Performed at [facility], [address] [place], [place] [address]   Blood Culture ID Panel (Reflexed)     Status: Abnormal   Collection Time: 03/25/19 10:14 PM  Result Value Ref Range Status   Enterococcus species NOT DETECTED NOT DETECTED Final   Listeria monocytogenes NOT DETECTED NOT DETECTED Final   Staphylococcus species DETECTED (A) NOT DETECTED Final    Comment: Methicillin (oxacillin) susceptible coagulase negative staphylococcus. Possible blood culture contaminant (unless isolated from more than one blood culture draw or clinical case suggests pathogenicity). No antibiotic treatment is indicated for blood  culture contaminants. CRITICAL RESULT CALLED TO, READ BACK BY AND VERIFIED WITH: SCOTT HALL AT 8295 03/27/19 SDR    Staphylococcus aureus (BCID) NOT DETECTED NOT DETECTED Final   Methicillin resistance NOT DETECTED NOT DETECTED Final  Streptococcus species NOT DETECTED NOT DETECTED Final   Streptococcus agalactiae NOT DETECTED NOT DETECTED Final   Streptococcus pneumoniae NOT DETECTED NOT DETECTED Final   Streptococcus pyogenes NOT DETECTED NOT DETECTED Final   Acinetobacter baumannii NOT DETECTED NOT DETECTED Final   Enterobacteriaceae species NOT DETECTED NOT DETECTED Final   Enterobacter cloacae complex NOT DETECTED NOT DETECTED Final   Escherichia coli NOT DETECTED NOT DETECTED Final   Klebsiella oxytoca NOT DETECTED NOT DETECTED Final   Klebsiella pneumoniae NOT DETECTED NOT DETECTED  Final   Proteus species NOT DETECTED NOT DETECTED Final   Serratia marcescens NOT DETECTED NOT DETECTED Final   Haemophilus influenzae NOT DETECTED NOT DETECTED Final   Neisseria meningitidis NOT DETECTED NOT DETECTED Final   Pseudomonas aeruginosa NOT DETECTED NOT DETECTED Final   Candida albicans NOT DETECTED NOT DETECTED Final   Candida glabrata NOT DETECTED NOT DETECTED Final   Candida krusei NOT DETECTED NOT DETECTED Final   Candida parapsilosis NOT DETECTED NOT DETECTED Final   Candida tropicalis NOT DETECTED NOT DETECTED Final    Comment: Performed at [facility], [address] [place], [place] [address]  Blood Culture (routine x 2)     Status: Abnormal (Preliminary result)   Collection Time: 03/25/19 10:19 PM   Specimen: BLOOD  Result Value Ref Range Status   Specimen Description   Final    BLOOD LEFT FOREARM Performed at [facility], [address] [place], [place] [address]    Special Requests   Final    BOTTLES DRAWN AEROBIC AND ANAEROBIC Blood Culture adequate volume Performed at [facility], [address] [place], [place] [address]    Culture  Setup Time   Final    AEROBIC BOTTLE ONLY GRAM POSITIVE COCCI CRITICAL RESULT CALLED TO, READ BACK BY AND VERIFIED WITH: SCOTT HALL ON 03/27/19 AT 0300 [facility] Performed at [facility], [address] [place], [place] [address]    Culture (A)  Final    STAPHYLOCOCCUS EPIDERMIDIS Sent to [facility] for further susceptibility testing. Performed at [facility], [address] [place], [place] [address]    Report Status PENDING  Incomplete  Urine Culture     Status: None   Collection Time: 03/26/19  6:12 PM   Specimen: Urine, Clean Catch  Result Value Ref Range Status   Specimen Description   Final    URINE, CLEAN CATCH Performed at [facility], [address] [place], [place] [address]    Special Requests   Final    NONE Performed at [facility], [address] [place], [place] [address]    Culture   Final    NO GROWTH Performed at [facility], [address] [place], [place] [address]    Report Status 03/28/2019 FINAL  Final  CULTURE, BLOOD (ROUTINE X 2) w Reflex to ID Panel     Status: None   Collection Time: 03/30/19  4:25 PM   Specimen: BLOOD  Result Value Ref Range Status   Specimen Description BLOOD BLOOD RIGHT HAND  Final   Special Requests   Final    BOTTLES DRAWN AEROBIC AND ANAEROBIC Blood Culture adequate volume   Culture   Final    NO GROWTH 5 DAYS Performed at [facility], [address] [place], [place] [address]    Report Status 04/04/2019 FINAL  Final  CULTURE, BLOOD (ROUTINE X 2) w Reflex to ID Panel     Status: None  Collection Time: 03/30/19  4:26 PM   Specimen: BLOOD  Result Value Ref Range Status   Specimen Description BLOOD BLOOD LEFT HAND  Final   Special Requests   Final    BOTTLES DRAWN AEROBIC AND ANAEROBIC Blood Culture adequate volume   Culture   Final    NO GROWTH 5 DAYS Performed at [facility], [address] [place], [place] [address]    Report Status 04/04/2019 FINAL  Final     Labs: CBC: Recent Labs  Lab 03/28/19 1554 03/29/19 0732 03/30/19 0519 03/31/19 0902  WBC 8.9 10.1 10.2 16.5*  HGB 13.7 13.0 13.5 13.7  HCT 42.5 40.0 41.5 42.4  MCV 90.0 89.7 89.4 90.0  PLT 286 288 289 341   Basic Metabolic Panel: Recent Labs  Lab 03/28/19 1554 03/28/19 1554 03/29/19 0732 03/30/19 0519 03/31/19 0902 04/02/19 0432 04/03/19 0912  NA 137  --  139 140 138  --  134*  K 4.5  --  3.6 4.2 3.9  --  4.2  CL 104  --  105 103 102  --  96*  CO2 21*  --  27 28 28   --  26  GLUCOSE 388*  --  127* 113* 232*  --  161*  BUN 21  --  16 15 16   --  18  CREATININE 0.77   < > 0.71 0.77 0.64 0.59* 0.72  CALCIUM 8.5*  --  8.5* 8.5* 8.7*  --  8.9   < > = values in this interval not displayed.   Liver Function Tests: Recent Labs  Lab 03/28/19 1554 03/29/19 0732  03/30/19 0519 03/31/19 0902  AST 34 27 26 42*  ALT 19 16 18 30   ALKPHOS 79 63 64 76  BILITOT 0.6 0.7 0.5 0.8  PROT 6.7 6.3* 6.3* 6.9  ALBUMIN 2.7* 2.4* 2.4* 2.8*   No results for input(s): LIPASE, AMYLASE in the last 168 hours. No results for input(s): AMMONIA in the last 168 hours. Cardiac Enzymes: No results for input(s): CKTOTAL, CKMB, CKMBINDEX, TROPONINI in the last 168 hours. BNP (last 3 results) Recent Labs    03/26/19 0030  BNP 50.0   CBG: Recent Labs  Lab 04/03/19 0729 04/03/19 1142 04/03/19 1652 04/03/19 2137 04/04/19 0732  GLUCAP 94 210* 254* 298* 331*    Time spent: 35 minutes  Signed:  04/05/19  [facility]  04/04/2019 10:31 AM

## 2019-04-04 NOTE — Care Management Important Message (Signed)
Important Message  Patient Details  Name: Matix Henshaw MRN: [id number] Date of Birth: September 27, 1929   Medicare Important Message Given:  Yes Patient remains in isolation unit- IM put in discharge paperwork for patient to keep with records.     Allayne Butcher, RN 04/04/2019, 11:14 AM

## 2019-04-04 NOTE — Progress Notes (Addendum)
Inpatient Diabetes Program Recommendations  AACE/ADA: New Consensus Statement on Inpatient Glycemic Control (2015)  Target Ranges:  Prepandial:   less than 140 mg/dL      Peak postprandial:   less than 180 mg/dL (1-2 hours)      Critically ill patients:  140 - 180 mg/dL   Lab Results  Component Value Date   GLUCAP 331 (H) 04/04/2019   HGBA1C 8.8 (H) 03/26/2019    Review of Glycemic Control Results for Max [name], Max [name] (MRN [id number]) as of 04/04/2019 09:28  Ref. Range 04/03/2019 11:42 04/03/2019 16:52 04/03/2019 21:37 04/04/2019 07:32  Glucose-Capillary Latest Ref Range: 70 - 99 mg/dL 430 (H) 148 (H) 403 (H) 331 (H)   Diabetes history: Type 2 DM Outpatient Diabetes medications: Glipizide 20 mg QAM, Lantus 15 units BID, Novolog 2-14 units TID, Actos 15 mg QD Current orders for Inpatient glycemic control: Lantus 25 units QHS, Novolog 5 units TID, Novolog 0-15 units TID Decadron 6 mg QD  Inpatient Diabetes Program Recommendations:    Consider  -Increasing meal coverage to Novolog 8 units TID -adding Tradjenta 5 mg QD.   Thanks, Lujean Rave, MSN, RNC-OB Diabetes Coordinator [phone] (8a-5p)

## 2019-04-04 NOTE — Progress Notes (Signed)
Call placed to daughter Efraim Kaufmann to make her aware that patient has been picked up by EMS.  Answered all questions.  Orson Ape, BSN

## 2019-04-04 NOTE — NC FL2 (Signed)
[place] MEDICAID FL2 LEVEL OF CARE SCREENING TOOL     IDENTIFICATION  Patient Name: Max [name] Birthdate: 1930-01-01 Sex: male Admission Date (Current Location): 03/25/2019  [place] and [place] Number:  Chiropodist and Address:  [facility], [address], [place], [place] [address]      Provider Number: [id number]  Attending Physician Name and Address:  Rolly Salter, MD  Relative Name and Phone Number:  Marcy Panning [phone]    Current Level of Care: Hospital Recommended Level of Care: Assisted Living Facility, Memory Care Prior Approval Number:    Date Approved/Denied:   PASRR Number:    Discharge Plan: Other (Comment)(ALF-Memory Care)    Current Diagnoses: Patient Active Problem List   Diagnosis Date Noted  . Sepsis (HCC) 03/26/2019  . Pneumonia due to COVID-19 virus 03/26/2019  . History of TIA (transient ischemic attack) 03/26/2019  . Chronic anticoagulation 03/26/2019  . HTN (hypertension) 03/26/2019  . Dementia (HCC) 03/26/2019  . Type 2 diabetes mellitus without complication, with long-term current use of insulin (HCC) 03/26/2019  . Acute metabolic encephalopathy 03/26/2019  . Acute respiratory failure with hypoxia (HCC) 03/26/2019    Orientation RESPIRATION BLADDER Height & Weight        Normal External catheter Weight: 113 kg Height:  5\' 11"  (180.3 cm)  BEHAVIORAL SYMPTOMS/MOOD NEUROLOGICAL BOWEL NUTRITION STATUS      Incontinent Diet(Dys 3 thin liquids)  AMBULATORY STATUS COMMUNICATION OF NEEDS Skin   Limited Assist Non-Verbally Normal                       Personal Care Assistance Level of Assistance  Bathing, Dressing, Feeding Bathing Assistance: Limited assistance Feeding assistance: Limited assistance Dressing Assistance: Limited assistance     Functional Limitations Info  Speech     Speech Info: Impaired    SPECIAL CARE FACTORS FREQUENCY  PT (By licensed PT)     PT  Frequency: 3x week              Contractures Contractures Info: Not present    Additional Factors Info  Code Status, Allergies Code Status Info: DNR Allergies Info: Metformin and related           Current Medications (04/04/2019):  This is the current hospital active medication list Current Facility-Administered Medications  Medication Dose Route Frequency Provider Last Rate Last Admin  . acetaminophen (TYLENOL) tablet 650 mg  650 mg Oral Q6H PRN 06/02/2019, MD       Or  . acetaminophen (TYLENOL) suppository 650 mg  650 mg Rectal Q6H PRN Andris Baumann, MD      . ascorbic acid (VITAMIN C) tablet 500 mg  500 mg Oral Daily Dhungel, Nishant, MD   500 mg at 04/04/19 0800  . bisacodyl (DULCOLAX) EC tablet 10 mg  10 mg Oral Daily PRN Dhungel, Nishant, MD      . brimonidine (ALPHAGAN) 0.2 % ophthalmic solution 1 drop  1 drop Right Eye BID Dhungel, Nishant, MD   1 drop at 04/04/19 0800   And  . timolol (TIMOPTIC) 0.5 % ophthalmic solution 1 drop  1 drop Right Eye BID Dhungel, Nishant, MD   1 drop at 04/04/19 0759  . cholecalciferol (VITAMIN D) tablet 2,000 Units  2,000 Units Oral Daily Dhungel, Nishant, MD   2,000 Units at 04/04/19 0801  . cycloSPORINE (RESTASIS) 0.05 % ophthalmic emulsion 1 drop  1 drop Right Eye BID Dhungel, Nishant, MD  1 drop at 04/04/19 0802  . darifenacin (ENABLEX) 24 hr tablet 7.5 mg  7.5 mg Oral Daily Dhungel, Nishant, MD   7.5 mg at 04/04/19 0831  . donepezil (ARICEPT) tablet 10 mg  10 mg Oral QHS Dhungel, Nishant, MD   10 mg at 04/03/19 2059  . DULoxetine (CYMBALTA) DR capsule 60 mg  60 mg Oral Daily Dhungel, Nishant, MD   60 mg at 04/04/19 0801  . enoxaparin (LOVENOX) injection 40 mg  40 mg Subcutaneous Q24H Dhungel, Nishant, MD   40 mg at 04/03/19 2057  . feeding supplement (ENSURE ENLIVE) (ENSURE ENLIVE) liquid 237 mL  237 mL Oral BID BM Dhungel, Nishant, MD   237 mL at 04/04/19 0800  . fluticasone (FLONASE) 50 MCG/ACT nasal spray 2 spray  2 spray Each  Nare Daily PRN Dhungel, Nishant, MD      . hydrALAZINE (APRESOLINE) tablet 25 mg  25 mg Oral Q8H PRN Manuela Schwartz, NP   25 mg at 03/31/19 0319  . insulin aspart (novoLOG) injection 0-15 Units  0-15 Units Subcutaneous TID WC Dhungel, Nishant, MD   11 Units at 04/04/19 0758  . insulin aspart (novoLOG) injection 5 Units  5 Units Subcutaneous TID WC Rolly Salter, MD   5 Units at 04/04/19 [phone]  . insulin detemir (LEVEMIR) injection 25 Units  25 Units Subcutaneous QHS Rolly Salter, MD   25 Units at 04/03/19 2212  . latanoprost (XALATAN) 0.005 % ophthalmic solution 1 drop  1 drop Right Eye QHS Dhungel, Nishant, MD   1 drop at 04/03/19 2058  . losartan (COZAAR) tablet 100 mg  100 mg Oral Daily Dhungel, Nishant, MD   100 mg at 04/04/19 0801  . memantine (NAMENDA) tablet 10 mg  10 mg Oral BID Dhungel, Nishant, MD   10 mg at 04/04/19 0801  . mirabegron ER (MYRBETRIQ) tablet 50 mg  50 mg Oral Daily Dhungel, Nishant, MD   50 mg at 04/04/19 0830  . multivitamin with minerals tablet 1 tablet  1 tablet Oral Daily Dhungel, Nishant, MD   1 tablet at 04/04/19 0800  . ondansetron (ZOFRAN) tablet 4 mg  4 mg Oral Q6H PRN Andris Baumann, MD       Or  . ondansetron [facility]) injection 4 mg  4 mg Intravenous Q6H PRN Andris Baumann, MD      . pantoprazole (PROTONIX) EC tablet 40 mg  40 mg Oral Daily Dhungel, Nishant, MD   40 mg at 04/04/19 0800  . senna-docusate (Senokot-S) tablet 1 tablet  1 tablet Oral QHS PRN Andris Baumann, MD      . sertraline (ZOLOFT) tablet 50 mg  50 mg Oral Daily Dhungel, Nishant, MD   50 mg at 04/04/19 0801  . traZODone (DESYREL) tablet 75 mg  75 mg Oral QHS Dhungel, Nishant, MD   75 mg at 04/03/19 2059  . vancomycin (VANCOREADY) IVPB 1750 mg/350 mL  1,750 mg Intravenous Q24H Ronnald Ramp, [place]   Stopped at 04/03/19 2215  . vitamin B-12 (CYANOCOBALAMIN) tablet 1,000 mcg  1,000 mcg Oral Daily Dhungel, Nishant, MD   1,000 mcg at 04/04/19 0800     Discharge Medications: DISCHARGE  MEDICATION:      Allergies as of 04/04/2019      Reactions   Metformin And Related          Medication List    STOP taking these medications   Eliquis 2.5 MG Tabs tablet Generic drug: apixaban   guaiFENesin 600  MG 12 hr tablet Commonly known as: MUCINEX   Melatonin 3 MG Tabs     TAKE these medications   acetaminophen 325 MG tablet Commonly known as: TYLENOL Take 650 mg by mouth every 4 (four) hours as needed.   ascorbic acid 500 MG tablet Commonly known as: VITAMIN C Take 500 mg by mouth daily.   Baqsimi One Pack 3 MG/DOSE Powd Generic drug: Glucagon Place 1 spray into the nose as needed. When sugar is <55   bisacodyl 5 MG EC tablet Commonly known as: DULCOLAX Take 10 mg by mouth daily as needed.   Cholecalciferol 25 MCG (1000 UT) tablet Take 2,000 Units by mouth daily.   Combigan 0.2-0.5 % ophthalmic solution Generic drug: brimonidine-timolol Place 1 drop into the right eye 2 (two) times daily.   cycloSPORINE 0.05 % ophthalmic emulsion Commonly known as: RESTASIS Place 1 drop into the right eye 2 (two) times daily.   donepezil 10 MG tablet Commonly known as: ARICEPT Take 10 mg by mouth at bedtime.   DULoxetine 60 MG capsule Commonly known as: CYMBALTA Take 60 mg by mouth daily.   feeding supplement (ENSURE ENLIVE) Liqd Take 237 mLs by mouth 2 (two) times daily between meals.   fluticasone 50 MCG/ACT nasal spray Commonly known as: FLONASE Place 2 sprays into both nostrils daily as needed.   glipiZIDE 10 MG 24 hr tablet Commonly known as: GLUCOTROL XL Take 20 mg by mouth daily with breakfast.   Lantus 100 UNIT/ML injection Generic drug: insulin glargine Inject 15 Units into the skin 2 (two) times daily.   latanoprost 0.005 % ophthalmic solution Commonly known as: XALATAN Place 1 drop into the right eye at bedtime.   loperamide 2 MG capsule Commonly known as: IMODIUM Take 2 mg by mouth every 6 (six) hours as needed for  diarrhea or loose stools.   losartan 100 MG tablet Commonly known as: COZAAR Take 100 mg by mouth daily.   memantine 10 MG tablet Commonly known as: NAMENDA Take 10 mg by mouth 2 (two) times daily.   mirtazapine 7.5 MG tablet Commonly known as: REMERON Take 7.5 mg by mouth at bedtime.   multivitamin with minerals Tabs tablet Take 1 tablet by mouth daily.   Myrbetriq 50 MG Tb24 tablet Generic drug: mirabegron ER Take 50 mg by mouth daily.   NovoLOG FlexPen 100 UNIT/ML FlexPen Generic drug: insulin aspart Inject 2-14 Units into the skin See admin instructions. Sliding Scale Insulin: If 0 - 150 = 0; 151 - 200 = 2u;  201 - 250 = 4u; 251 - 300 = 6u; 301 - 350 = 8 u; 351 - 400 = 10u; 401 - 450 = 12u; 451 - 500 = 14u subcutaneous before meals for DM>500 call [facility]   nystatin cream Commonly known as: MYCOSTATIN Apply 1 application topically See admin instructions. Apply topically to groin every 12 hours as needed for rash.   nystatin powder Generic drug: nystatin Apply 1 application topically See admin instructions. Apply to abdominal folds topically one time a day for redness until rash healed.   omeprazole 20 MG capsule Commonly known as: PRILOSEC Take 20 mg by mouth daily.   pioglitazone 15 MG tablet Commonly known as: ACTOS Take 15 mg by mouth daily.   ranitidine 150 MG tablet Commonly known as: ZANTAC Take 150 mg by mouth daily.   sertraline 50 MG tablet Commonly known as: ZOLOFT Take 50 mg by mouth daily.   solifenacin 10 MG tablet Commonly known as:  VESICARE Take 10 mg by mouth daily.   traZODone 150 MG tablet Commonly known as: DESYREL Take 75 mg by mouth at bedtime.   vitamin B-12 1000 MCG tablet Commonly known as: CYANOCOBALAMIN Take 1,000 mcg by mouth daily.   Zinc 50 MG Tabs Take by mouth.         Relevant Imaging Results:  Relevant Lab Results:   Additional Information [id number]  Shelbie Hutching, RN

## 2019-04-04 NOTE — TOC Transition Note (Signed)
Transition of Care [facility]) - CM/SW Discharge Note   Patient Details  Name: Phyllip Claw MRN: [id number] Date of Birth: 1929/09/10  Transition of Care [facility]) CM/SW Contact:  Allayne Butcher, RN Phone Number: 04/04/2019, 12:17 PM   Clinical Narrative:    Patient is medically ready for discharge back to [facility] today.  [facility] will provide transportation, transportation arranged by this RNCM.  Discharge packet delivered to the unit and bedside RN aware.  This RNCM has contacted Olegario Messier at [facility] and she is aware of discharge.  Patient's daughter also notified of patient discharge.     Final next level of care: Memory Care Barriers to Discharge: Barriers Resolved   Patient Goals and CMS Choice Patient states their goals for this hospitalization and ongoing recovery are:: Pt unable to communicate due to severe dementia. Daughter wants him to get better and return back to [facility]      Discharge Placement              Patient [facility] bed at: [facility]) Patient to be transferred to facility by: [facility] Name of family member notified: Efraim Kaufmann- daughter Patient and family notified of of transfer: 04/04/19  Discharge Plan and Services In-house Referral: Clinical Social Work Discharge Planning Services: NA                                 Social Determinants of Health (SDOH) Interventions     Readmission Risk Interventions No flowsheet data found.

## 2019-04-06 LAB — SUSCEPTIBILITY RESULT

## 2019-04-06 LAB — SUSCEPTIBILITY, AER + ANAEROB

## 2019-04-12 LAB — CULTURE, BLOOD (ROUTINE X 2)
Special Requests: ADEQUATE
Special Requests: ADEQUATE

## 2019-08-02 DEATH — deceased

## 2020-05-26 IMAGING — CT CT HEAD W/O CM
2 of 3 series · 12 of 37 positions shown, 15 images · non-contrast
Comparison: 02/19/2018

CLINICAL DATA: Mental status changes and fever.

EXAM:
CT HEAD WITHOUT CONTRAST
TECHNIQUE: Contiguous axial images were obtained from the base of the skull
through the vertex without intravenous contrast.

[Series 4: sagittal soft tissue · sagittal · 0.29mm/px · 3 of 57 slices shown]
[im 19/57  brain]
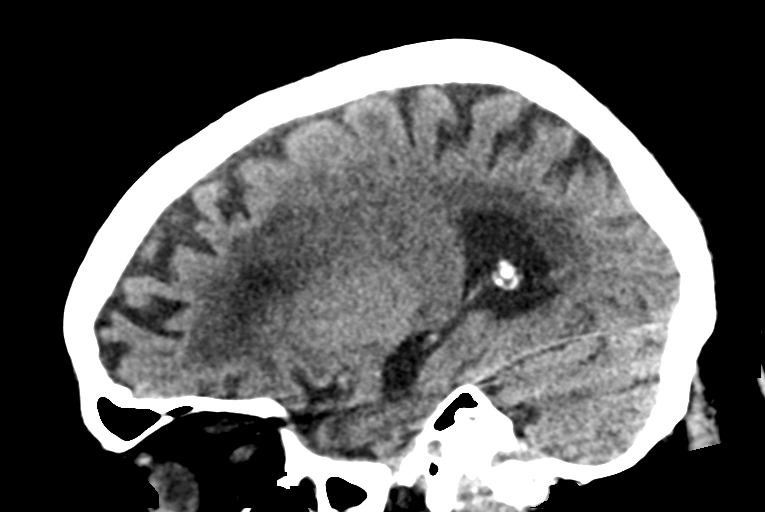
[im 29/57  brain]
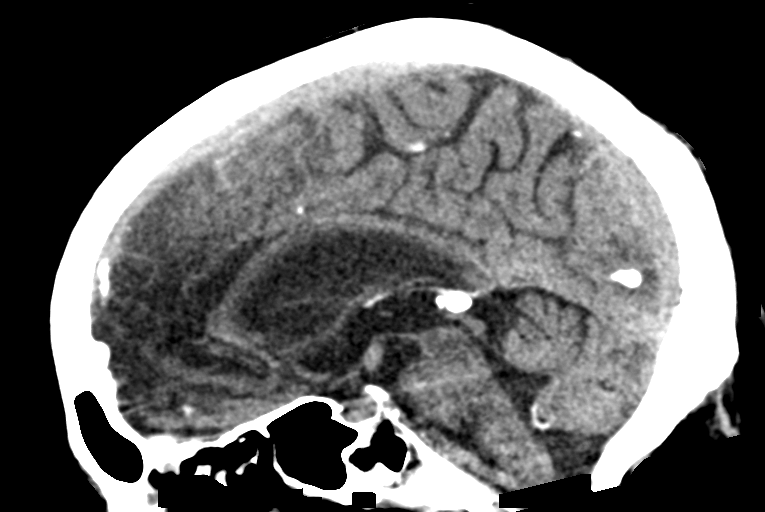
[im 38/57  brain]
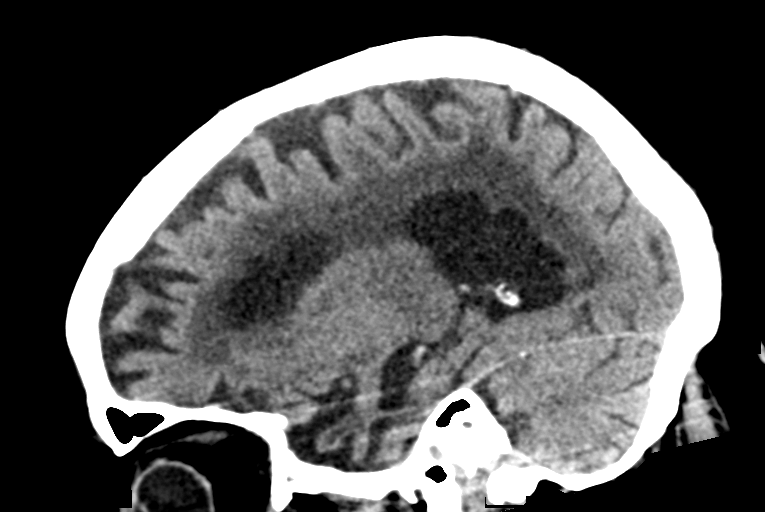

[Series 5: ax head wo · axial · 0.33mm/px · z∈[+147,+256]mm · 9 of 30 slices shown, 12 images]
[im 3/30  brain]
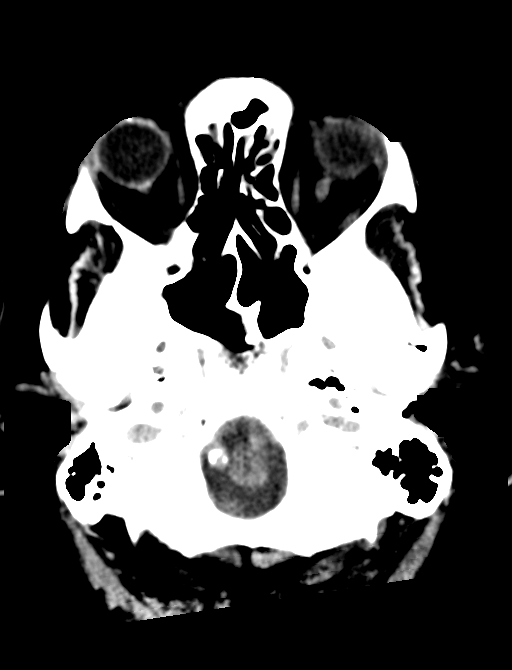
[im 3/30  bone]
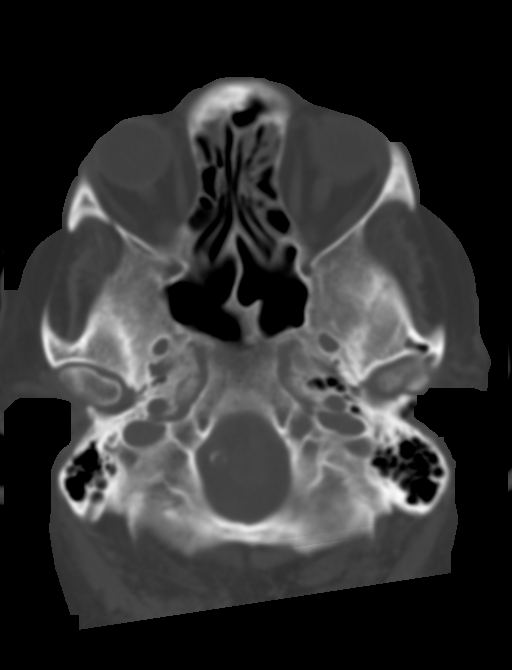
[im 7/30  brain]
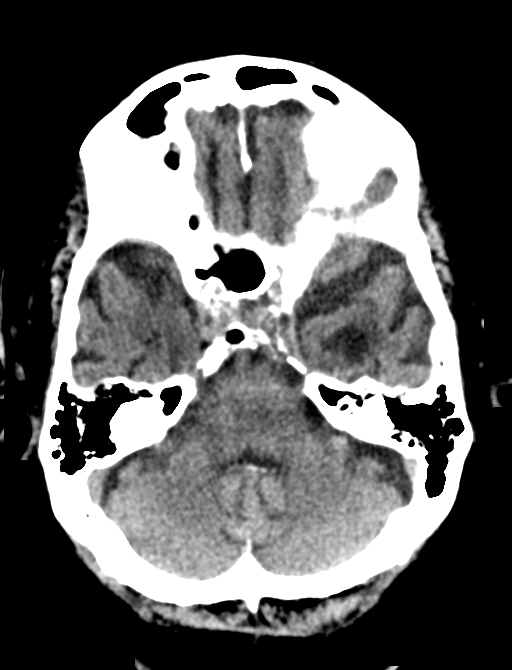
[im 9/30  brain]
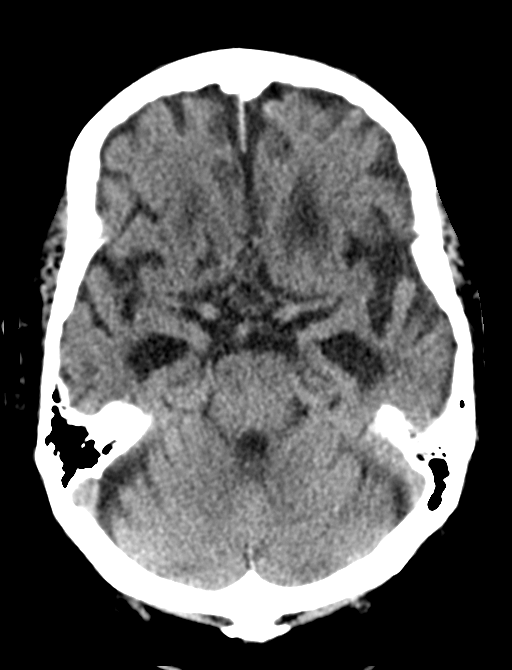
[im 13/30  brain]
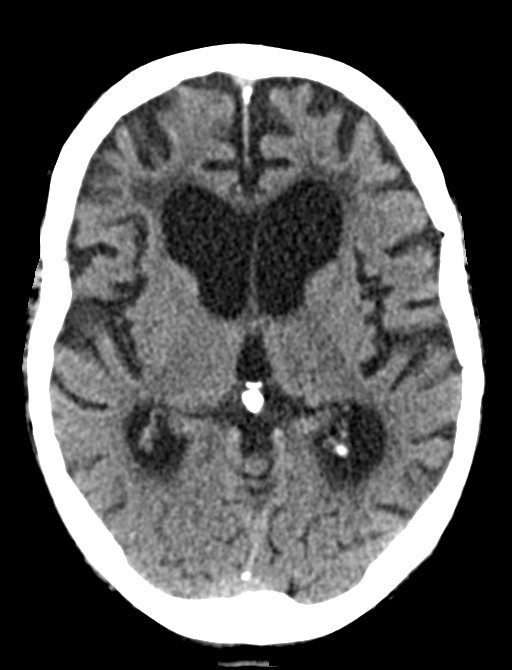
[im 15/30  brain]
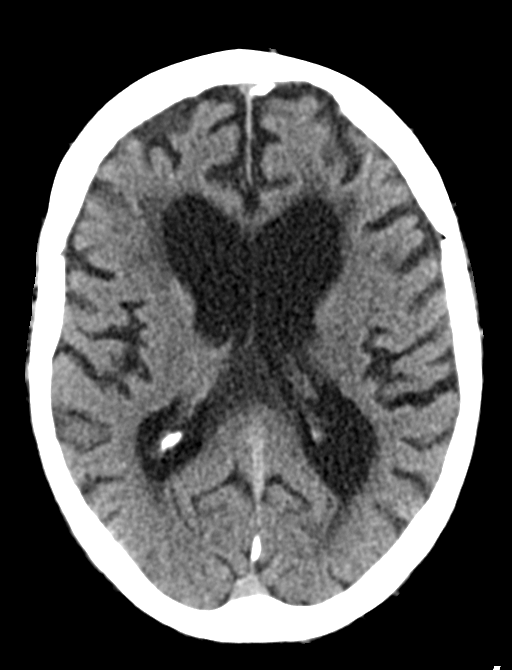
[im 15/30  bone]
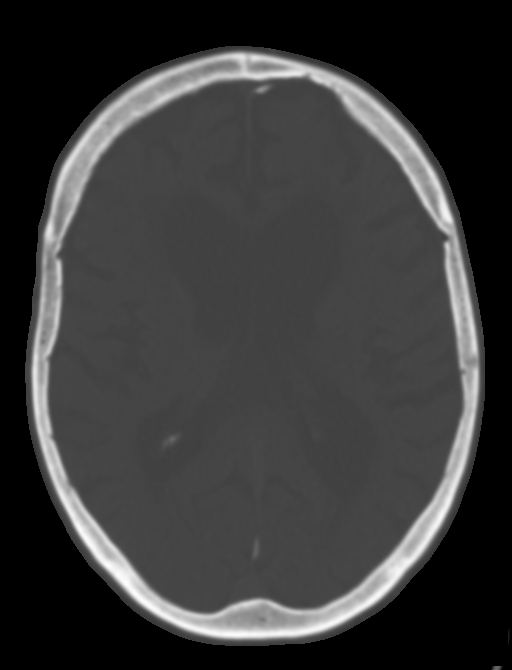
[im 17/30  brain]
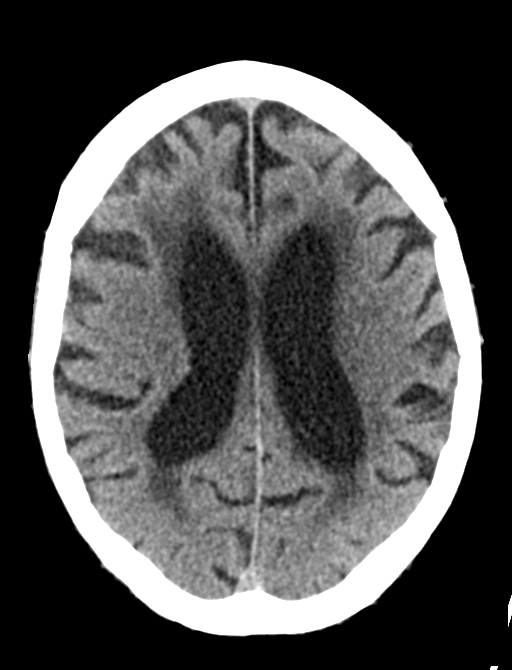
[im 21/30  brain]
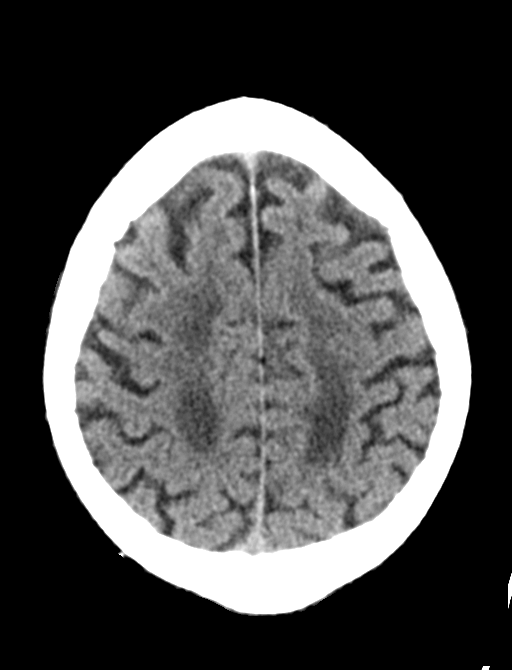
[im 23/30  brain]
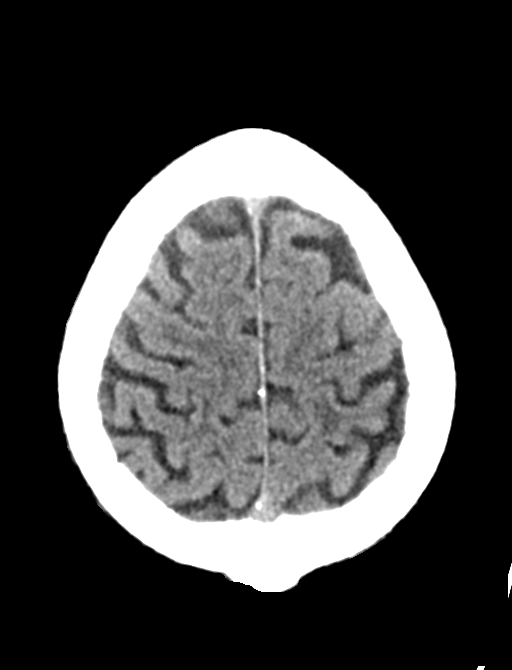
[im 27/30  brain]
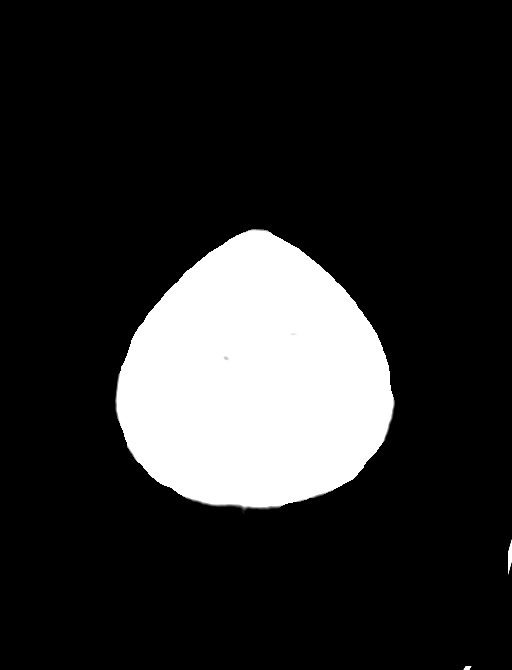
[im 27/30  bone]
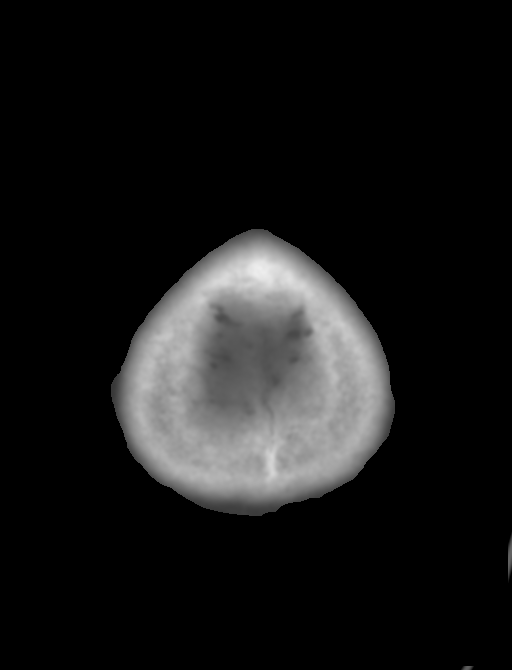

[12 of 37 positions shown; findings below may reference images not displayed]

FINDINGS: Brain: There is no evidence for acute hemorrhage, hydrocephalus,
mass lesion, or abnormal extra-axial fluid collection. No definite
CT evidence for acute infarction. Diffuse loss of parenchymal volume
is consistent with atrophy. Patchy low attenuation in the deep
hemispheric and periventricular white matter is nonspecific, but
likely reflects chronic microvascular ischemic demyelination. Stable
prominence of the lateral ventricles, presumably from central
atrophy.

Vascular: No hyperdense vessel or unexpected calcification.

Skull: Normal. Negative for fracture or focal lesion. No evidence
for fracture. No worrisome lytic or sclerotic lesion.

Sinuses/Orbits: The visualized paranasal sinuses and mastoid air
cells are clear. Visualized portions of the globes and intraorbital
fat are unremarkable.

Other: None.
IMPRESSION: 1. No acute intracranial abnormality.
2. Atrophy with chronic small vessel white matter ischemic disease.
